# Patient Record
Sex: Male | Born: 1955
Health system: Southern US, Community
[De-identification: ages and names within clinical notes are randomized; demographics above are authoritative.]

## PROBLEM LIST (undated history)

## (undated) DIAGNOSIS — N401 Enlarged prostate with lower urinary tract symptoms: Secondary | ICD-10-CM

## (undated) DIAGNOSIS — R3912 Poor urinary stream: Secondary | ICD-10-CM

## (undated) DIAGNOSIS — E785 Hyperlipidemia, unspecified: Secondary | ICD-10-CM

## (undated) DIAGNOSIS — N138 Other obstructive and reflux uropathy: Secondary | ICD-10-CM

## (undated) DIAGNOSIS — K5904 Chronic idiopathic constipation: Secondary | ICD-10-CM

## (undated) DIAGNOSIS — N529 Male erectile dysfunction, unspecified: Secondary | ICD-10-CM

## (undated) DIAGNOSIS — E559 Vitamin D deficiency, unspecified: Secondary | ICD-10-CM

## (undated) DIAGNOSIS — R972 Elevated prostate specific antigen [PSA]: Secondary | ICD-10-CM

## (undated) DIAGNOSIS — E611 Iron deficiency: Secondary | ICD-10-CM

## (undated) DIAGNOSIS — A048 Other specified bacterial intestinal infections: Secondary | ICD-10-CM

## (undated) DIAGNOSIS — I10 Essential (primary) hypertension: Secondary | ICD-10-CM

## (undated) DIAGNOSIS — K219 Gastro-esophageal reflux disease without esophagitis: Secondary | ICD-10-CM

## (undated) HISTORY — PX: NO PAST SURGERIES: SHX2092

## (undated) HISTORY — DX: Other specified bacterial intestinal infections: A04.8

## (undated) HISTORY — PX: PROSTATE BIOPSY: SHX241

## (undated) HISTORY — DX: Hyperlipidemia, unspecified: E78.5

---

## 1995-12-30 HISTORY — PX: CYST REMOVAL TRUNK: SHX6283

## 2000-08-10 ENCOUNTER — Emergency Department (HOSPITAL_COMMUNITY): Admission: EM | Admit: 2000-08-10 | Discharge: 2000-08-10 | Payer: Self-pay | Admitting: *Deleted

## 2007-06-04 ENCOUNTER — Ambulatory Visit (HOSPITAL_COMMUNITY): Admission: RE | Admit: 2007-06-04 | Discharge: 2007-06-04 | Payer: Self-pay | Admitting: Infectious Diseases

## 2008-11-06 ENCOUNTER — Emergency Department (HOSPITAL_COMMUNITY): Admission: EM | Admit: 2008-11-06 | Discharge: 2008-11-06 | Payer: Self-pay | Admitting: Emergency Medicine

## 2008-11-09 ENCOUNTER — Emergency Department (HOSPITAL_COMMUNITY): Admission: EM | Admit: 2008-11-09 | Discharge: 2008-11-09 | Payer: Self-pay | Admitting: Emergency Medicine

## 2009-12-13 ENCOUNTER — Emergency Department (HOSPITAL_COMMUNITY): Admission: EM | Admit: 2009-12-13 | Discharge: 2009-12-13 | Payer: Self-pay | Admitting: Family Medicine

## 2010-10-07 ENCOUNTER — Encounter: Payer: Self-pay | Admitting: Cardiology

## 2010-10-10 ENCOUNTER — Ambulatory Visit: Payer: Self-pay | Admitting: Cardiology

## 2010-10-10 ENCOUNTER — Telehealth (INDEPENDENT_AMBULATORY_CARE_PROVIDER_SITE_OTHER): Payer: Self-pay | Admitting: *Deleted

## 2010-10-10 DIAGNOSIS — R079 Chest pain, unspecified: Secondary | ICD-10-CM | POA: Insufficient documentation

## 2010-10-14 ENCOUNTER — Encounter: Payer: Self-pay | Admitting: Cardiovascular Disease

## 2010-10-14 ENCOUNTER — Encounter: Payer: Self-pay | Admitting: *Deleted

## 2010-10-14 ENCOUNTER — Ambulatory Visit: Payer: Self-pay

## 2010-10-14 ENCOUNTER — Encounter (HOSPITAL_COMMUNITY)
Admission: RE | Admit: 2010-10-14 | Discharge: 2010-10-29 | Payer: Self-pay | Source: Home / Self Care | Attending: Cardiology | Admitting: Cardiology

## 2010-10-14 ENCOUNTER — Ambulatory Visit: Payer: Self-pay | Admitting: Cardiology

## 2010-10-16 ENCOUNTER — Encounter: Payer: Self-pay | Admitting: Cardiology

## 2010-10-17 ENCOUNTER — Telehealth: Payer: Self-pay | Admitting: Cardiology

## 2011-01-28 NOTE — Progress Notes (Signed)
Summary: stress test results  Phone Note Call from Patient Call back at Work Phone [phone]   Caller: Patient Summary of Call: stress test results Initial call taken by: Judie Grieve,  October 17, 2010 10:59 AM  Follow-up for Phone Call        Spoke with pt. Pt. aware of stress test results. Pt. verbalized understanding Follow-up by: Ollen Gross, RN, BSN,  October 17, 2010 11:06 AM

## 2011-01-28 NOTE — Progress Notes (Signed)
Summary: Nuclear pre procedure  Phone Note Outgoing Call Call back at Home Phone [phone]   Call placed by: Rea College, CMA,  October 10, 2010 3:30 PM Call placed to: Patient Summary of Call: Left message with information on Myoview Information Sheet (see scanned document for details).      Nuclear Med Background Indications for Stress Test: Evaluation for Ischemia     Symptoms: Chest Pain    Nuclear Pre-Procedure Cardiac Risk Factors: History of Smoking

## 2011-01-28 NOTE — Consult Note (Signed)
Summary: [facility]  [facility]   Imported By: Earl Many 11/27/2010 16:53:19  _____________________________________________________________________  External Attachment:    Type:   Image     Comment:   External Document

## 2011-01-28 NOTE — Letter (Signed)
Summary: Results Follow-up  [facility], Main Office  [address]   [place], [place] [address]   Phone: [phone]  Fax: [phone]     October 16, 2010 MRN: [id number]   Larry Martin [address] [place], [place]  [address]   Dear [name],  We have received the results from your recent tests and have been unable to contact you.  Please call our office at the number listed above so that Dr.  Shirlee Latch                           or his nurse may review the results with you.    Thank you,  [facility]

## 2011-01-28 NOTE — Assessment & Plan Note (Signed)
Summary: np6/chest pains   Visit Type:  new pt Primary Provider:  Dr. Renae Gloss  CC:  chest pain, headaches, and .  History of Present Illness: 55 yo with minimal past medical history presents for evaluation of chest/abdominal pain.  Patient has been having crampy, gas-type pains in his epigastrium.  This has been going on for several months.  He thinks that it occurs the most when he has not eaten for a long time (he works long hours and sometimes goes extended periods without eating).  The pain is not related to exertion.  He has excellent exercise tolerance.  He says that he can climb 6 flights of steps with no chest pain and minimal shortness of breath (from ground floor of hospital to 6th floor.  He also gets epigastric/lower chest burning at times after meals that is GERD-like. No syncope or lightheadedness.  No smoking.  No family history of premature CAD.  No HTN or hyperlipidemia.  He had similar symptoms back in 2004 that were thought to be noncardiac.   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  Married  Tobacco Use - Former, quit in 2010.  Alcohol Use - no Regular Exercise - no Smoking Status:  quit Does Patient Exercise:  no  Review of Systems       All systems reviewed and negative except as per HPI.   Vital Signs:  Patient profile:   55 year old male Height:      66 inches Weight:      156.25 pounds BMI:     25.31 Pulse rate:   66 / minute BP sitting:   90 / 58  (left  arm) Cuff size:   large  Vitals Entered By: Caralee Ates CMA (October 10, 2010 11:24 AM)  Physical Exam  General:  Well developed, well nourished, in no acute distress. Head:  normocephalic and atraumatic Nose:  no deformity, discharge, inflammation, or lesions Mouth:  Teeth, gums and palate normal. Oral mucosa normal. Neck:  Neck supple, no JVD. No masses, thyromegaly or abnormal cervical nodes. Lungs:  Clear bilaterally to auscultation and percussion. Heart:  Non-displaced PMI, chest non-tender; regular rate and rhythm, S1, S2 without murmurs, rubs or gallops. Carotid upstroke normal, no bruit.  Pedals normal pulses. No edema, no varicosities. Abdomen:  Bowel sounds positive; abdomen soft and non-tender without masses, organomegaly, or hernias noted. No hepatosplenomegaly. Msk:  Back normal, normal gait. Muscle strength and tone normal. Extremities:  No clubbing or cyanosis. Neurologic:  Alert and oriented x 3. Skin:  Intact without lesions or rashes. Psych:  Normal affect.   Impression & Recommendations:  Problem # 1:  CHEST PAIN (ICD-786.50) Patient's pain is actually more epigastric and manifests as a cramp when he has not eaten in a long time or GERD-like symptoms after eating certain foods.  I suspect that it is GI.  I will have him start omeprazole 20 mg daily.  Given the new onset, I will get an ETT-myoview to make sure that there is no evidence for cardiac  ischemia.   Other Orders: Nuclear Stress Test (Nuc Stress Test)  Patient Instructions: 1)  Your physician has recommended you make the following change in your medication:  2)  Take Omeprazole 20mg  daily--you do not need a prescription for this. 3)  Your physician has requested that you have an exercise stress myoview.  For further information please visit [email or web address]  Please follow instruction sheet, as given.THIS NEEDS TO BE MONDAY OCTOBER 19,1478. 4)  Your physician recommends that you schedule a follow-up  appointment as needed with Dr Shirlee Latch.

## 2011-01-28 NOTE — Assessment & Plan Note (Signed)
Summary: Cardiology Nuclear Testing  Nuclear Med Background Indications for Stress Test: Evaluation for Ischemia     Symptoms: Chest Pain    Nuclear Pre-Procedure Cardiac Risk Factors: History of Smoking Caffeine/Decaff Intake: none NPO After: 7:00 AM Lungs: clear IV 0.9% NS with Angio Cath: 22g     IV Site: R Hand IV Started by: Cathlyn Parsons, RN Chest Size (in) 42     Height (in): 66 Weight (lb): 154 BMI: 24.95  Nuclear Med Study 1 or 2 day study:  1 day     Stress Test Type:  Stress Reading MD:  Cassell Clement, MD     Referring MD:  D.McLean Resting Radionuclide:  Technetium 60m Tetrofosmin     Resting Radionuclide Dose:  10.5 mCi  Stress Radionuclide:  Technetium 80m Tetrofosmin     Stress Radionuclide Dose:  33.0 mCi   Stress Protocol Exercise Time (min):  9:01 min     Max HR:  146 bpm     Predicted Max HR:  169 bpm  Max Systolic BP: 171 mm Hg     Percent Max HR:  86.39 %     METS: 10.10 Rate Pressure Product:  16109    Stress Test Technologist:  Milana Na, EMT-P     Nuclear Technologist:  Harlow Asa, CNMT  Rest Procedure  Myocardial perfusion imaging was performed at rest 45 minutes following the intravenous administration of Technetium 63m Tetrofosmin.  Stress Procedure  The patient exercised for 9:01. The patient stopped due to fatigue and chest burning.  There were no significant ST-T wave changes.  Technetium 2m Tetrofosmin was injected at peak exercise and myocardial perfusion imaging was performed after a brief delay.  QPS Raw Data Images:  Normal; no motion artifact; normal heart/lung ratio. Stress Images:  Normal homogeneous uptake in all areas of the myocardium. Rest Images:  Normal homogeneous uptake in all areas of the myocardium. Subtraction (SDS):  No evidence of ischemia. Transient Ischemic Dilatation:  1.06  (Normal <1.22)  Lung/Heart Ratio:  .32  (Normal <0.45)  Quantitative Gated Spect Images QGS EDV:  95 ml QGS ESV:  44  ml QGS EF:  54 % QGS cine images:  Normal LV systolic function.  Findings Normal nuclear study      Overall Impression  Exercise Capacity: Good exercise capacity. BP Response: Normal blood pressure response. Clinical Symptoms: Mild chest pain/dyspnea. ECG Impression: No significant ST segment change suggestive of ischemia. Overall Impression: Normal stress nuclear study. Overall Impression Comments: No ischemia by perfusion study.  Normal LV systolic function.  Appended Document: Cardiology Nuclear Testing Normal myoview.   Appended Document: Cardiology Nuclear Testing  lm to cb left message to call back  Appended Document: Cardiology Nuclear Testing LVM  Appended Document: Cardiology Nuclear Testing LVM x2. Will mail a letter.  Appended Document: Cardiology Nuclear Testing Pt. aware.

## 2011-05-31 ENCOUNTER — Inpatient Hospital Stay (INDEPENDENT_AMBULATORY_CARE_PROVIDER_SITE_OTHER)
Admission: RE | Admit: 2011-05-31 | Discharge: 2011-05-31 | Disposition: A | Discharge status: Home / Self Care | Payer: 59 | Source: Ambulatory Visit | Attending: Family Medicine | Admitting: Family Medicine

## 2011-05-31 DIAGNOSIS — J019 Acute sinusitis, unspecified: Secondary | ICD-10-CM

## 2011-09-30 LAB — CBC
HCT: 45.9
Hemoglobin: 14.4
MCHC: 31.2
MCV: 76.5 — ABNORMAL LOW
Platelets: 165
RBC: 6 — ABNORMAL HIGH

## 2011-09-30 LAB — COMPREHENSIVE METABOLIC PANEL
BUN: 6
CO2: 27
Chloride: 106
GFR calc Af Amer: >60
Glucose, Bld: 89
Sodium: 140

## 2011-09-30 LAB — DIFFERENTIAL
Basophils Relative: 0
Eosinophils Relative: 0

## 2012-08-15 ENCOUNTER — Emergency Department (HOSPITAL_COMMUNITY)
Admission: EM | Admit: 2012-08-15 | Discharge: 2012-08-15 | Discharge status: Against Medical Advice | Payer: 59 | Attending: Emergency Medicine | Admitting: Emergency Medicine

## 2012-08-15 ENCOUNTER — Encounter (HOSPITAL_COMMUNITY): Payer: Self-pay | Admitting: *Deleted

## 2012-08-15 DIAGNOSIS — J029 Acute pharyngitis, unspecified: Secondary | ICD-10-CM | POA: Insufficient documentation

## 2012-08-15 NOTE — ED Notes (Signed)
Pt alert, arrives from home, c/o sore throat, onset was Wednesday, resp even unlabored, skin pwd

## 2013-01-05 ENCOUNTER — Encounter: Payer: Self-pay | Admitting: Cardiology

## 2013-01-12 ENCOUNTER — Ambulatory Visit: Payer: 59 | Admitting: Sports Medicine

## 2013-03-21 ENCOUNTER — Ambulatory Visit (INDEPENDENT_AMBULATORY_CARE_PROVIDER_SITE_OTHER): Payer: 59 | Admitting: Sports Medicine

## 2013-03-21 ENCOUNTER — Encounter: Payer: Self-pay | Admitting: Sports Medicine

## 2013-03-21 VITALS — BP 112/74 | HR 94 | Ht 66.0 in | Wt 155.0 lb

## 2013-03-21 DIAGNOSIS — M25559 Pain in unspecified hip: Secondary | ICD-10-CM

## 2013-03-21 DIAGNOSIS — M25551 Pain in right hip: Secondary | ICD-10-CM

## 2013-03-21 MED ORDER — MELOXICAM 15 MG PO TABS: ORAL_TABLET | ORAL | Status: DC

## 2013-03-21 NOTE — Progress Notes (Signed)
Subjective: Larry Martin is a pleasant 56-y.o. new patient who presents to clinic c/o right hamstring pain for the past 2 weeks.  Denies any trauma or injury related to onset.  Pain is mainly localized to the body of the hamstring muscle in the middle of his posterior right leg.  Some occasional extension of pain to the superior and inferior aspects of the hamstring muscles, but no radiation to the buttocks or lower leg.  He states that his pain is worst first thing in the morning as he goes to get out of bed or when he goes from a sedentary position to an active position (i.e., rising from a chair to walk).  Pain improves with activity and movement.  He has not been taking any pain medications for his discomfort.  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  Denies any previous injury of the right leg.  Patient does note that, approximately 3 months, ago he was lifting a bag of cement at home when he felt a sudden pain in his right-sided lower back.  He did not feel any pain in his leg/hamstring at that time and states that the lower back pain subsided within a couple of days.  Denies any further problems with his lower back.  Allergies: NKDA. Patient denies tobacco or alcohol use. Denies family history of diabetes, heart disease, or high blood pressure.   Review of Systems: Negative except as noted above.  Objective: GENERAL - Well-developed, well-nourished.  NAD. RIGHT LEG - No apparent deformity on inspection.  No swelling or bruising. O palpable defect. Tender to palpation over the hamstrings with increased point tenderness in the medial hamstring muscle belly.  Full range of motion with some hamstring pain on knee extension.  Resisted knee flexion elicits hamstring pain. Good quadriceps and hamstring strength.   Patellar and Achilles reflexes intact B/L. Right lower extremity is neurovascularly intact. BACK - No apparent deformity. Flexion at waist elicits right hamstring pain. Negative seated and supine SLR. Good strength bilaterally. Achilles and patellar reflexes are equal bilaterally. Patient walks without a significant limp.  LIMITED MSK ULTRASOUND RIGHT HAMSTRING -I do not appreciate any areas of abnormal hypoechogenicity or increased neovascularization to suggest injury. Scan was concentrated over the area of the semi-tendinosis/semimembranosus.  Assessment: 1. Right hamstring pain secondary to probable strain  Plan: No evidence of acute injury on ultrasound. Patient was fitted with a thigh compression sleeve and given an eccentric home exercise program.  He is to wear the compression sleeve at work and during activity. Mobic 15 mg x7 days, then PRN for pain.  He will return to clinic for follow-up in 3 weeks.  If his symptoms persist or worsen, we will consider initiating formal physical therapy.   Dictated by Lonzo Candy, MS4

## 2013-04-18 ENCOUNTER — Ambulatory Visit: Payer: 59 | Admitting: Sports Medicine

## 2013-04-25 ENCOUNTER — Ambulatory Visit: Payer: 59 | Admitting: Sports Medicine

## 2014-12-24 ENCOUNTER — Encounter (HOSPITAL_COMMUNITY): Payer: Self-pay | Admitting: Emergency Medicine

## 2014-12-24 ENCOUNTER — Observation Stay (HOSPITAL_COMMUNITY)
Admission: EM | Admit: 2014-12-24 | Discharge: 2014-12-26 | Disposition: A | Discharge status: Home / Self Care | Payer: 59 | Attending: Internal Medicine | Admitting: Internal Medicine

## 2014-12-24 ENCOUNTER — Emergency Department (HOSPITAL_COMMUNITY): Payer: 59

## 2014-12-24 DIAGNOSIS — R079 Chest pain, unspecified: Secondary | ICD-10-CM | POA: Diagnosis not present

## 2014-12-24 DIAGNOSIS — Z720 Tobacco use: Secondary | ICD-10-CM | POA: Diagnosis not present

## 2014-12-24 DIAGNOSIS — R5383 Other fatigue: Secondary | ICD-10-CM | POA: Diagnosis not present

## 2014-12-24 DIAGNOSIS — Z7982 Long term (current) use of aspirin: Secondary | ICD-10-CM | POA: Diagnosis not present

## 2014-12-24 DIAGNOSIS — E785 Hyperlipidemia, unspecified: Secondary | ICD-10-CM | POA: Diagnosis present

## 2014-12-24 DIAGNOSIS — R0602 Shortness of breath: Secondary | ICD-10-CM | POA: Insufficient documentation

## 2014-12-24 LAB — CBC
HEMATOCRIT: 43 % (ref 39.0–52.0)
Hemoglobin: 13.8 g/dL (ref 13.0–17.0)
MCH: 24.4 pg — ABNORMAL LOW (ref 26.0–34.0)
MCHC: 32.1 g/dL (ref 30.0–36.0)
MCV: 76.1 fL — ABNORMAL LOW (ref 78.0–100.0)
Platelets: 167 10*3/uL (ref 150–400)
RBC: 5.65 MIL/uL (ref 4.22–5.81)
RDW: 13.1 % (ref 11.5–15.5)
WBC: 4.5 10*3/uL (ref 4.0–10.5)

## 2014-12-24 LAB — I-STAT TROPONIN, ED: Troponin i, poc: 0 ng/mL (ref 0.00–0.08)

## 2014-12-24 LAB — BASIC METABOLIC PANEL
ANION GAP: 9 (ref 5–15)
BUN: 11 mg/dL (ref 6–23)
CALCIUM: 9.3 mg/dL (ref 8.4–10.5)
CO2: 23 mmol/L (ref 19–32)
CREATININE: 1.17 mg/dL (ref 0.50–1.35)
Chloride: 106 mEq/L (ref 96–112)
GFR calc Af Amer: 79 mL/min — ABNORMAL LOW (ref >90)
GFR calc non Af Amer: 68 mL/min — ABNORMAL LOW (ref >90)
GLUCOSE: 130 mg/dL — AB (ref 70–99)
Potassium: 3.8 mmol/L (ref 3.5–5.1)
Sodium: 138 mmol/L (ref 135–145)

## 2014-12-24 LAB — TROPONIN I: Troponin I: <0.03 ng/mL (ref <0.031)

## 2014-12-24 MED ORDER — ONDANSETRON HCL 4 MG/2ML IJ SOLN: 4.0000 mg | Freq: Four times a day (QID) | INTRAMUSCULAR | Status: DC | PRN

## 2014-12-24 MED ORDER — ENOXAPARIN SODIUM 40 MG/0.4ML ~~LOC~~ SOLN
40.0000 mg | SUBCUTANEOUS | Status: DC
  Administered 2014-12-24: 40 mg via SUBCUTANEOUS
  Filled 2014-12-24 (×2): qty 0.4

## 2014-12-24 MED ORDER — OXYCODONE HCL 5 MG PO TABS: 5.0000 mg | ORAL_TABLET | ORAL | Status: DC | PRN

## 2014-12-24 MED ORDER — ASPIRIN EC 325 MG PO TBEC
325.0000 mg | DELAYED_RELEASE_TABLET | Freq: Every day | ORAL | Status: DC
  Administered 2014-12-25 – 2014-12-26 (×2): 325 mg via ORAL
  Filled 2014-12-24 (×2): qty 1

## 2014-12-24 MED ORDER — SODIUM CHLORIDE 0.9 % IV SOLN: 1000.0000 mL | INTRAVENOUS | Status: DC

## 2014-12-24 MED ORDER — SODIUM CHLORIDE 0.9 % IV SOLN: 1000.0000 mL | Freq: Once | INTRAVENOUS | Status: DC

## 2014-12-24 MED ORDER — SODIUM CHLORIDE 0.9 % IV SOLN: 250.0000 mL | INTRAVENOUS | Status: DC | PRN

## 2014-12-24 MED ORDER — FENTANYL CITRATE 0.05 MG/ML IJ SOLN
25.0000 ug | Freq: Once | INTRAMUSCULAR | Status: AC
  Administered 2014-12-24: 25 ug via INTRAVENOUS
  Filled 2014-12-24: qty 2

## 2014-12-24 MED ORDER — SODIUM CHLORIDE 0.9 % IJ SOLN
3.0000 mL | Freq: Two times a day (BID) | INTRAMUSCULAR | Status: DC
  Administered 2014-12-24 – 2014-12-26 (×3): 3 mL via INTRAVENOUS

## 2014-12-24 MED ORDER — SODIUM CHLORIDE 0.9 % IJ SOLN: 3.0000 mL | Freq: Two times a day (BID) | INTRAMUSCULAR | Status: DC

## 2014-12-24 MED ORDER — ACETAMINOPHEN 325 MG PO TABS: 650.0000 mg | ORAL_TABLET | Freq: Four times a day (QID) | ORAL | Status: DC | PRN

## 2014-12-24 MED ORDER — ACETAMINOPHEN 650 MG RE SUPP: 650.0000 mg | Freq: Four times a day (QID) | RECTAL | Status: DC | PRN

## 2014-12-24 MED ORDER — SODIUM CHLORIDE 0.9 % IJ SOLN: 3.0000 mL | INTRAMUSCULAR | Status: DC | PRN

## 2014-12-24 MED ORDER — ALUM & MAG HYDROXIDE-SIMETH 200-200-20 MG/5ML PO SUSP
30.0000 mL | Freq: Four times a day (QID) | ORAL | Status: DC | PRN
Start: 2014-12-24 — End: 2014-12-26

## 2014-12-24 MED ORDER — HYDROMORPHONE HCL 1 MG/ML IJ SOLN
0.5000 mg | INTRAMUSCULAR | Status: DC | PRN
  Administered 2014-12-25: 1 mg via INTRAVENOUS
  Administered 2014-12-25: 0.5 mg via INTRAVENOUS
  Filled 2014-12-24 (×2): qty 1

## 2014-12-24 MED ORDER — ONDANSETRON HCL 4 MG PO TABS: 4.0000 mg | ORAL_TABLET | Freq: Four times a day (QID) | ORAL | Status: DC | PRN

## 2014-12-24 MED ORDER — SODIUM CHLORIDE 0.9 % IV BOLUS (SEPSIS)
1000.0000 mL | Freq: Once | INTRAVENOUS | Status: AC
  Administered 2014-12-24: 1000 mL via INTRAVENOUS

## 2014-12-24 MED ORDER — SODIUM CHLORIDE 0.9 % IV SOLN
INTRAVENOUS | Status: DC
  Administered 2014-12-24: 23:00:00 via INTRAVENOUS

## 2014-12-24 NOTE — ED Provider Notes (Signed)
CSN: [id number]     Arrival date & time 12/24/14  1700 History   First MD Initiated Contact with Patient 12/24/14 1701     Chief Complaint  Patient presents with  . Chest Pain     (Consider location/radiation/quality/duration/timing/severity/associated sxs/prior Treatment) Patient is a 58 y.o. male presenting with chest pain.  Chest Pain Pain location:  Substernal area Pain quality: pressure   Pain radiates to:  L shoulder and R shoulder Pain radiates to the back: no   Pain severity:  Severe Onset quality:  Gradual Duration:  1 week Timing:  Intermittent Progression:  Waxing and waning Chronicity:  New (had CP over 3 years ago, had stress test by Dr. Jackquline Denmark) Relieved by:  Nitroglycerin and rest Worsened by:  Exertion and deep breathing Ineffective treatments:  None tried Associated symptoms: fatigue and shortness of breath (beginning today)   Associated symptoms: no abdominal pain, no back pain, no cough, no diaphoresis, no fever, no headache, no nausea, no numbness, no orthopnea, no syncope, not vomiting and no weakness   Risk factors: male sex and smoking   Risk factors: no coronary artery disease, no diabetes mellitus, no high cholesterol and no hypertension   Risk factors comment:  No early familiy hx of CAD   History reviewed. No pertinent past medical history. History reviewed. No pertinent past surgical history. History reviewed. No pertinent family history. History  Substance Use Topics  . Smoking status: Current Every Day Smoker -- 0.30 packs/day    Types: Cigarettes  . Smokeless tobacco: Never Used  . Alcohol Use: No    Review of Systems  Constitutional: Positive for fatigue. Negative for fever and diaphoresis.  HENT: Negative for sore throat.   Eyes: Negative for visual disturbance.  Respiratory: Positive for shortness of breath (beginning today). Negative for cough.   Cardiovascular: Positive for chest pain (pressure). Negative for orthopnea and syncope.   Gastrointestinal: Negative for nausea, vomiting and abdominal pain.  Genitourinary: Negative for difficulty urinating.  Musculoskeletal: Negative for back pain and neck stiffness.  Skin: Negative for rash.  Neurological: Negative for syncope, weakness, numbness and headaches.      Allergies  Review of patient's allergies indicates no known allergies.  Home Medications   Prior to Admission medications   Medication Sig Start Date End Date Taking? Authorizing Provider  aspirin 325 MG tablet Take 325 mg by mouth daily.   Yes Historical Provider, MD  meloxicam (MOBIC) 15 MG tablet Take 1 tablet by mouth daily for 7 days with food, then take as needed. Patient not taking: Reported on 12/24/2014 03/21/13   Carlos Levering Draper, DO   BP 101/52 mmHg  Pulse 64  Temp(Src) 97.8 F (36.6 C) (Oral)  Resp 14  Ht 5\' 6"  (1.676 m)  Wt 167 lb 11.2 oz (76.068 kg)  BMI 27.08 kg/m2  SpO2 99% Physical Exam  Constitutional: He is oriented to person, place, and time. He appears well-developed and well-nourished. No distress.  HENT:  Head: Normocephalic and atraumatic.  Eyes: Conjunctivae and EOM are normal.  Neck: Normal range of motion.  Cardiovascular: Normal rate, regular rhythm, normal heart sounds and intact distal pulses.  Exam reveals no gallop and no friction rub.   No murmur heard. Pulmonary/Chest: Effort normal and breath sounds normal. No respiratory distress. He has no wheezes. He has no rales.  Abdominal: Soft. He exhibits no distension. There is no tenderness. There is no guarding.  Musculoskeletal: He exhibits no edema or tenderness.  Neurological: He is alert  and oriented to person, place, and time.  Skin: Skin is warm and dry. He is not diaphoretic.  Nursing note and vitals reviewed.   ED Course  Procedures (including critical care time) Labs Review Labs Reviewed  CBC - Abnormal; Notable for the following:    MCV 76.1 (*)    MCH 24.4 (*)    All other components within normal  limits  BASIC METABOLIC PANEL - Abnormal; Notable for the following:    Glucose, Bld 130 (*)    GFR calc non Af Amer 68 (*)    GFR calc Af Amer 79 (*)    All other components within normal limits  TROPONIN I  TROPONIN I  BASIC METABOLIC PANEL  CBC  TROPONIN I  TROPONIN I  LIPID PANEL  I-STAT TROPOININ, ED    Imaging Review Dg Chest 2 View  12/24/2014   CLINICAL DATA:  Shortness of breath and chest pain  EXAM: CHEST  2 VIEW  COMPARISON:  06/04/2007  FINDINGS: The heart size and mediastinal contours are within normal limits. Both lungs are clear. The visualized skeletal structures are unremarkable.  IMPRESSION: No active cardiopulmonary disease.   Electronically Signed   By: Inez Catalina M.D.   On: 12/24/2014 19:18     EKG Interpretation None       EMERGENCY DEPARTMENT [place] CARDIAC EXAM "Study: Limited Ultrasound of the heart and pericardium"  INDICATIONS:Hypotension Multiple views of the heart and pericardium were obtained in real-time with a multi-frequency probe.  PERFORMED [id number]  IMAGES ARCHIVED?: Yes  FINDINGS: No pericardial effusion, Normal contractility, IVC normal and Tamponade physiology absent  LIMITATIONS:  None  VIEWS USED: Subcostal 4 chamber, Parasternal long axis, Parasternal short axis, Apical 4 chamber  and Inferior Vena Cava  INTERPRETATION: Cardiac activity present, Pericardial effusioin absent, Cardiac tamponade absent, Volume status normal and Normal contractility   MDM   Final diagnoses:  Chest pain    58 year old male with no significant medical history presents with concern for increasing chest pressure on exertion for one week. Differential diagnosis includes PE, pericarditis, pneumothorax, ACS, angina, dissection.  EKG was done and evaluated by me and showed normal sinus rhythm without any acute ST changes. Chest x-ray was within normal limits.  Patient received aspirin 324mg  at home and nitroglycerin with EMS and had improvement of his  chest pain, however blood pressure decreased.  Given patient's transient hypotension a bedside ultrasound was performed which showed no signs of pericardial effusion or tamponade. His blood pressures continued to improve and feel his hypotension was likely secondary to nitroglycerin.  Symptoms are not consistent with aortic dissection, pulmonary embolism, or pericarditis.  Troponin was checked and was negative. Hospitalist was consulted for admission given concern for typical symptoms of angina with increasing frequency and presence of rest. He was admitted in stable condition.    Alvino Chapel, MD 12/25/14 6226  Ezequiel Essex, MD 12/25/14 3335

## 2014-12-24 NOTE — H&P (Signed)
[facility] Admission History and Physical       Larry Martin LGX:[id number] DOB: 01-30-1956 DOA: 12/24/2014  Referring physician: EDP PCP: Salena Saner., MD  Specialists:   Chief Complaint: Chest Pain  HPI: Larry Martin is a 58 y.o. male with a history of Hyperlipidemia who presents with complaints of intermittent chest pain described as pressure like and as heaviness in the substernal chest for 1 week.  The pain is associated with SOB and occasionally diaphoresis and lasts for minutes at a time.   He rates the pain at the worse has been a 7/10.   He denies any nausea or vomiting.   The pain occurs with exertion.    His ED workup has been negative so far.   He reports that he had a Stress Test several years ago.      Review of Systems:  Constitutional: No Weight Loss, No Weight Gain, Night Sweats, Fevers, Chills, Dizziness, Fatigue, or Generalized Weakness HEENT: No Headaches, Difficulty Swallowing,Tooth/Dental Problems,Sore Throat,  No Sneezing, Rhinitis, Ear Ache, Nasal Congestion, or Post Nasal Drip,  Cardio-vascular:   +Chest pain, Orthopnea, PND, Edema in Lower Extremities, Anasarca, Dizziness, Palpitations  Resp: +Dyspnea, No DOE, No Productive Cough, No Non-Productive Cough, No Hemoptysis, No Wheezing.    GI: No Heartburn, Indigestion, Abdominal Pain, Nausea, Vomiting, Diarrhea, Hematemesis, Hematochezia, Melena, Change in Bowel Habits,  Loss of Appetite  GU: No Dysuria, Change in Color of Urine, No Urgency or Frequency, No Flank pain.  Musculoskeletal: No Joint Pain or Swelling, No Decreased Range of Motion, No Back Pain.  Neurologic: No Syncope, No Seizures, Muscle Weakness, Paresthesia, Vision Disturbance or Loss, No Diplopia, No Vertigo, No Difficulty Walking,  Skin: No Rash or Lesions. Psych: No Change in Mood or Affect, No Depression or Anxiety, No Memory loss, No Confusion, or Hallucinations   History reviewed. No pertinent past medical history.  except for Hyperlipidemia    History reviewed. No pertinent past surgical history.     Prior to Admission medications   Medication Sig Start Date End Date Taking? Authorizing Provider  aspirin 325 MG tablet Take 325 mg by mouth daily.   Yes Historical Provider, MD  meloxicam (MOBIC) 15 MG tablet Take 1 tablet by mouth daily for 7 days with food, then take as needed. Patient not taking: Reported on 12/24/2014 03/21/13   Thurman Coyer, DO      No Known Allergies   Social History:  reports that he has been smoking Cigarettes.  He has been smoking about 0.30 packs per day. He has never used smokeless tobacco. He reports that he does not drink alcohol. His drug history is not on file.     History reviewed. No pertinent family history.     Physical Exam:  GEN:  Pleasant Well Nourished and Well developed  58 y.o. Haitian-American male  examined  and in no acute distress; cooperative with exam Filed Vitals:   12/24/14 2017 12/24/14 2100 12/24/14 2115 12/24/14 2201  BP: 101/71 116/77 115/75 105/61  Pulse: 58 64 66 65  Temp:    98.6 F (37 C)  TempSrc:      Resp: 16   15  Height:    5\' 6"  (1.676 m)  Weight:    76.068 kg (167 lb 11.2 oz)  SpO2: 98% 99% 100% 100%   Blood pressure 105/61, pulse 65, temperature 98.6 F (37 C), temperature source Oral, resp. rate 15, height 5\' 6"  (1.676 m), weight 76.068 kg (167 lb 11.2 oz),  SpO2 100 %. PSYCH: He is alert and oriented x4; does not appear anxious does not appear depressed; affect is normal HEENT: Normocephalic and Atraumatic, Mucous membranes pink; PERRLA; EOM intact; Fundi:  Benign;  No scleral icterus, Nares: Patent, Oropharynx: Clear, Fair Dentition,    Neck:  FROM, No Cervical Lymphadenopathy nor Thyromegaly or Carotid Bruit; No JVD; Breasts:: Not examined CHEST WALL: No tenderness CHEST: Normal respiration, clear to auscultation bilaterally HEART: Regular rate and rhythm; no murmurs rubs or gallops BACK: No kyphosis or  scoliosis; No CVA tenderness ABDOMEN: Positive Bowel Sounds, Soft Non-Tender; No Masses, No Organomegaly,. Rectal Exam: Not done EXTREMITIES: No Cyanosis, Clubbing, or Edema; No Ulcerations. Genitalia: not examined PULSES: 2+ and symmetric SKIN: Normal hydration no rash or ulceration CNS: Alert and Oriented x 4, No Focal Deficits  Vascular: pulses palpable throughout    Labs on Admission:  Basic Metabolic Panel:  Recent Labs Lab 12/24/14 1719  NA 138  K 3.8  CL 106  CO2 23  GLUCOSE 130*  BUN 11  CREATININE 1.17  CALCIUM 9.3   Liver Function Tests: No results for input(s): AST, ALT, ALKPHOS, BILITOT, PROT, ALBUMIN in the last 168 hours. No results for input(s): LIPASE, AMYLASE in the last 168 hours. No results for input(s): AMMONIA in the last 168 hours. CBC:  Recent Labs Lab 12/24/14 1719  WBC 4.5  HGB 13.8  HCT 43.0  MCV 76.1*  PLT 167   Cardiac Enzymes:  Recent Labs Lab 12/24/14 2002  TROPONINI <0.03    BNP (last 3 results) No results for input(s): PROBNP in the last 8760 hours. CBG: No results for input(s): GLUCAP in the last 168 hours.  Radiological Exams on Admission: Dg Chest 2 View  12/24/2014   CLINICAL DATA:  Shortness of breath and chest pain  EXAM: CHEST  2 VIEW  COMPARISON:  06/04/2007  FINDINGS: The heart size and mediastinal contours are within normal limits. Both lungs are clear. The visualized skeletal structures are unremarkable.  IMPRESSION: No active cardiopulmonary disease.   Electronically Signed   By: Inez Catalina M.D.   On: 12/24/2014 19:18     EKG: Independently reviewed. Normal Sinus Rhythm rate 78   Assessment/Plan:   58 y.o. male with   Principal Problem:   1.   Chest pain   Telemetry Monitoring   Cycle Troponins   ASA Rx   Check Lipids in AM   2D ECHO in AM   Active Problems:   2.   Hyperlipidemia- on OTC Niacin Rx   Check Fasting Lipids in AM       3.   DVT Prophylaxis    Lovenox    Code Status:    FULL  CODE Family Communication:    Wife at Bedside Disposition Plan:       Observation /Telemtry Bed  Time spent:  21 Minutes  Theressa Millard [facility] Pager [phone]   If [place] Please Contact the Day Rounding Team MD for [facility]  If 7PM-7AM, Please Contact Night-Floor Coverage  [email or web address] Password [facility] 12/24/2014, 10:31 PM

## 2014-12-24 NOTE — ED Notes (Signed)
PER EMS: Pt was picked up after worsening chest pain that has been intermittent for the past week.  Pt sts pain is worse with exertion.  Pt took 324 of ASA before EMS arrived, and had 2 nitro during transit.  Pain went from 7 to 4, but BP also dropped so treatment was stopped.

## 2014-12-25 ENCOUNTER — Observation Stay (HOSPITAL_COMMUNITY): Payer: 59

## 2014-12-25 DIAGNOSIS — E785 Hyperlipidemia, unspecified: Secondary | ICD-10-CM

## 2014-12-25 DIAGNOSIS — R079 Chest pain, unspecified: Secondary | ICD-10-CM

## 2014-12-25 DIAGNOSIS — I369 Nonrheumatic tricuspid valve disorder, unspecified: Secondary | ICD-10-CM

## 2014-12-25 DIAGNOSIS — R0789 Other chest pain: Secondary | ICD-10-CM

## 2014-12-25 LAB — CBC
HEMATOCRIT: 42.2 % (ref 39.0–52.0)
Hemoglobin: 12.9 g/dL — ABNORMAL LOW (ref 13.0–17.0)
MCH: 23.1 pg — ABNORMAL LOW (ref 26.0–34.0)
MCHC: 30.6 g/dL (ref 30.0–36.0)
MCV: 75.6 fL — ABNORMAL LOW (ref 78.0–100.0)
PLATELETS: 143 10*3/uL — AB (ref 150–400)
RBC: 5.58 MIL/uL (ref 4.22–5.81)
RDW: 13.2 % (ref 11.5–15.5)
WBC: 4 10*3/uL (ref 4.0–10.5)

## 2014-12-25 LAB — BASIC METABOLIC PANEL
ANION GAP: 8 (ref 5–15)
BUN: 11 mg/dL (ref 6–23)
CALCIUM: 8.5 mg/dL (ref 8.4–10.5)
CO2: 22 mmol/L (ref 19–32)
Chloride: 110 mEq/L (ref 96–112)
Creatinine, Ser: 1.05 mg/dL (ref 0.50–1.35)
GFR calc Af Amer: 90 mL/min — ABNORMAL LOW (ref >90)
GFR calc non Af Amer: 78 mL/min — ABNORMAL LOW (ref >90)
Glucose, Bld: 95 mg/dL (ref 70–99)
Potassium: 4.1 mmol/L (ref 3.5–5.1)
SODIUM: 140 mmol/L (ref 135–145)

## 2014-12-25 LAB — TROPONIN I: Troponin I: <0.03 ng/mL (ref <0.031)

## 2014-12-25 LAB — LIPID PANEL
Cholesterol: 180 mg/dL (ref 0–200)
HDL: 39 mg/dL — AB (ref >39)
LDL Cholesterol: 108 mg/dL — ABNORMAL HIGH (ref 0–99)
Total CHOL/HDL Ratio: 4.6 RATIO
Triglycerides: 166 mg/dL — ABNORMAL HIGH (ref <150)
VLDL: 33 mg/dL (ref 0–40)

## 2014-12-25 MED ORDER — TECHNETIUM TC 99M SESTAMIBI - CARDIOLITE
30.0000 | Freq: Once | INTRAVENOUS | Status: AC | PRN
  Administered 2014-12-25: 30 via INTRAVENOUS

## 2014-12-25 MED ORDER — TECHNETIUM TC 99M SESTAMIBI - CARDIOLITE
10.0000 | Freq: Once | INTRAVENOUS | Status: AC | PRN
  Administered 2014-12-25: 13:00:00 10 via INTRAVENOUS

## 2014-12-25 NOTE — Progress Notes (Signed)
Patient Demographics  Larry Martin, is a 58 y.o. male, DOB - 1956/07/19, PIR:[id number]  Admit date - 12/24/2014   Admitting Physician Theressa Millard, MD  Outpatient Primary MD for the patient is No primary care provider on file.  LOS - 1   Chief Complaint  Patient presents with  . Chest Pain      Admission history of present illness/brief narrative:  Larry Martin is a 58 y.o. male with a history of Hyperlipidemia who presents with complaints of intermittent chest pain described as pressure like and as heaviness in the substernal chest for 1 week. The pain is associated with SOB and occasionally diaphoresis and lasts for minutes at a time. He rates the pain at the worse has been a 7/10. He denies any nausea or vomiting. The pain occurs with exertion.He had negative troponins 3, no acute EKG changes, went for stress test on 12/28, finding significant for small area of mild reversibility involving the apical segment of the anterior wall.  Subjective:   Larry Martin today has, No headache, No chest pain, No abdominal pain - No Nausea, No new weakness tingling or numbness, No Cough - SOB.   Assessment & Plan    Principal Problem:   Chest pain Active Problems:   Hyperlipidemia  Chest pain - Continue on telemetry monitoring,  - Troponins are negative X4. - continue with aspirin,  - 2-D echo showing EF 50-55 percent, grade 2 diastolic dysfunction - Nuclear stress test showing small area of reversibility involving the apical segment of the inferior wall.     Code Status: Full code  Family Communication: None at bedside  Disposition Plan: Remains on telemetry   Procedures  Nuclear stress test 12/28   Consults   Cardiology  Medications  Scheduled Meds: . aspirin EC  325 mg Oral Daily  . enoxaparin (LOVENOX) injection  40 mg  Subcutaneous Q24H  . sodium chloride  3 mL Intravenous Q12H  . sodium chloride  3 mL Intravenous Q12H   Continuous Infusions: . sodium chloride 75 mL/hr at 12/24/14 2308   PRN Meds:.sodium chloride, acetaminophen **OR** acetaminophen, alum & mag hydroxide-simeth, HYDROmorphone (DILAUDID) injection, ondansetron **OR** ondansetron (ZOFRAN) IV, oxyCODONE, sodium chloride  DVT Prophylaxis  Lovenox -   Lab Results  Component Value Date   PLT 143* 12/25/2014    Antibiotics    Anti-infectives    None          Objective:   Filed Vitals:   12/25/14 1151 12/25/14 1152 12/25/14 1153 12/25/14 1610  BP: 179/67  151/66 119/72  Pulse: 148 154 106 75  Temp:    98.6 F (37 C)  TempSrc:    Oral  Resp:    19  Height:      Weight:      SpO2:    99%    Wt Readings from Last 3 Encounters:  12/25/14 76.386 kg (168 lb 6.4 oz)  03/21/13 70.308 kg (155 lb)  10/14/10 69.854 kg (154 lb)     Intake/Output Summary (Last 24 hours) at 12/25/14 1720 Last data filed at 12/25/14 1612  Gross per 24 hour  Intake      0 ml  Output    300 ml  Net   -  300 ml     Physical Exam  Awake Alert, Oriented X 3, No new F.N deficits, Normal affect Erma.AT,PERRAL Supple Neck,No JVD, No cervical lymphadenopathy appriciated.  Symmetrical Chest wall movement, Good air movement bilaterally, CTAB RRR,No Gallops,Rubs or new Murmurs, No Parasternal Heave +ve B.Sounds, Abd Soft, No tenderness, No organomegaly appriciated, No rebound - guarding or rigidity. No Cyanosis, Clubbing or edema, No new Rash or bruise     Data Review   Micro Results No results found for this or any previous visit (from the past 240 hour(s)).  Radiology Reports Dg Chest 2 View  12/24/2014   CLINICAL DATA:  Shortness of breath and chest pain  EXAM: CHEST  2 VIEW  COMPARISON:  06/04/2007  FINDINGS: The heart size and mediastinal contours are within normal limits. Both lungs are clear. The visualized skeletal structures are  unremarkable.  IMPRESSION: No active cardiopulmonary disease.   Electronically Signed   By: Inez Catalina M.D.   On: 12/24/2014 19:18   Nm Myocar Multi W/spect W/wall Motion / Ef  12/25/2014   CLINICAL DATA:  Shortness of Breath and chest pain  EXAM: MYOCARDIAL IMAGING WITH SPECT (REST AND PHARMACOLOGIC-STRESS)  GATED LEFT VENTRICULAR WALL MOTION STUDY  LEFT VENTRICULAR EJECTION FRACTION  TECHNIQUE: Standard myocardial SPECT imaging was performed after resting intravenous injection of 10 mCi Tc-21m sestamibi. Subsequently, intravenous infusion of Lexiscan was performed under the supervision of the Cardiology staff. At peak effect of the drug, 30 mCi Tc-11m sestamibi was injected intravenously and standard myocardial SPECT imaging was performed. Quantitative gated imaging was also performed to evaluate left ventricular wall motion, and estimate left ventricular ejection fraction.  COMPARISON:  None.  FINDINGS: Perfusion: There is a small area of mild reversibility involving the apical segment of the anterior wall.  Wall Motion: Normal left ventricular wall motion. No left ventricular dilation.  Left Ventricular Ejection Fraction: 62 %  End diastolic volume 81 ml  End systolic volume 31 ml  IMPRESSION: 1. Small area of reversibility is identified involving the apical segment of the anterior wall.  2. Normal left ventricular wall motion.  3. Left ventricular ejection fraction 62%  4. Low-risk stress test findings*.  These results will be called to the ordering clinician or representative by the Radiologist Assistant, and communication documented in the PACS or zVision Dashboard.  *2012 Appropriate Use Criteria for Coronary Revascularization Focused Update: J Am Coll Cardiol. 5852;77(8):242-353. http://content.airportbarriers.com.aspx?articleid=1201161   Electronically Signed   By: Kerby Moors M.D.   On: 12/25/2014 16:39    CBC  Recent Labs Lab 12/24/14 1719 12/25/14 0514  WBC 4.5 4.0  HGB 13.8 12.9*    HCT 43.0 42.2  PLT 167 143*  MCV 76.1* 75.6*  MCH 24.4* 23.1*  MCHC 32.1 30.6  RDW 13.1 13.2    Chemistries   Recent Labs Lab 12/24/14 1719 12/25/14 0514  NA 138 140  K 3.8 4.1  CL 106 110  CO2 23 22  GLUCOSE 130* 95  BUN 11 11  CREATININE 1.17 1.05  CALCIUM 9.3 8.5   ------------------------------------------------------------------------------------------------------------------ estimated creatinine clearance is 70.9 mL/min (by C-G formula based on Cr of 1.05). ------------------------------------------------------------------------------------------------------------------ No results for input(s): HGBA1C in the last 72 hours. ------------------------------------------------------------------------------------------------------------------  Recent Labs  12/25/14 0514  CHOL 180  HDL 39*  LDLCALC 108*  TRIG 166*  CHOLHDL 4.6   ------------------------------------------------------------------------------------------------------------------ No results for input(s): TSH, T4TOTAL, T3FREE, THYROIDAB in the last 72 hours.  Invalid input(s): FREET3 ------------------------------------------------------------------------------------------------------------------ No results for input(s): VITAMINB12, FOLATE, FERRITIN,  TIBC, IRON, RETICCTPCT in the last 72 hours.  Coagulation profile No results for input(s): INR, PROTIME in the last 168 hours.  No results for input(s): DDIMER in the last 72 hours.  Cardiac Enzymes  Recent Labs Lab 12/24/14 2239 12/25/14 0514 12/25/14 1404  TROPONINI <0.03 <0.03 <0.03   ------------------------------------------------------------------------------------------------------------------ Invalid input(s): POCBNP     Time Spent in minutes   30 minutes   Hiroko Tregre M.D on 12/25/2014 at 5:20 PM  Between 7am to 7pm - Pager - [phone]  After 7pm go to [email or web address] - password [email or web address]  And look for the night coverage  person covering for me after hours  [facility] Office  [phone]   **Disclaimer: This note may have been dictated with voice recognition software. Similar sounding words can inadvertently be transcribed and this note may contain transcription errors which may not have been corrected upon publication of note.**

## 2014-12-25 NOTE — Progress Notes (Signed)
GXT MV performed.

## 2014-12-25 NOTE — Progress Notes (Signed)
Patient had an episode of mid sternal chest pain, with mild reproduction  Palpation, resolved with dilaudid, will recheck tropnins in am, repeat EKG in am.

## 2014-12-25 NOTE — Progress Notes (Signed)
  Echocardiogram 2D Echocardiogram has been performed.  Diamond Nickel 12/25/2014, 9:33 AM

## 2014-12-25 NOTE — Progress Notes (Signed)
UR completed 

## 2014-12-25 NOTE — Consult Note (Signed)
CONSULT NOTE  Date: 12/25/2014               Patient Name:  Larry Martin MRN: [id number]  DOB: 1956/03/06 Age / Sex: 58 y.o., male        PCP: Salena Saner Primary Cardiologist: Aundra Dubin            Referring Physician: Elgergawy              Reason for Consult: Chest pain            History of Present Illness: Patient is a 58 y.o. male with a PMHx of hyperlipidemia, who was admitted to [facility] on 12/24/2014 for evaluation of  Chest discomfort. Marland Kitchen  Has had similar CP in the past Had a stress test 4 years ago    Location: mid sternal . Quality: gas like pain, heavy,  Duration: lasted 30 minutes  Timing:  Associated signs and symptoms: belcing.  Modifying factors: reliefed with belching  Context: worse with walking and activity  Occasional cigarette Occasional beer  Fhx : no cardiac hx.     Medications: Outpatient medications: Prescriptions prior to admission  Medication Sig Dispense Refill Last Dose  . aspirin 325 MG tablet Take 325 mg by mouth daily.   Past Month at Unknown time  . meloxicam (MOBIC) 15 MG tablet Take 1 tablet by mouth daily for 7 days with food, then take as needed. (Patient not taking: Reported on 12/24/2014) 40 tablet 0 Not Taking at Unknown time    Current medications: Current Facility-Administered Medications  Medication Dose Route Frequency Provider Last Rate Last Dose  . 0.9 %  sodium chloride infusion  250 mL Intravenous PRN Theressa Millard, MD      . 0.9 %  sodium chloride infusion   Intravenous Continuous Theressa Millard, MD 75 mL/hr at 12/24/14 2308    . acetaminophen (TYLENOL) tablet 650 mg  650 mg Oral Q6H PRN Theressa Millard, MD       Or  . acetaminophen (TYLENOL) suppository 650 mg  650 mg Rectal Q6H PRN Theressa Millard, MD      . alum & mag hydroxide-simeth (MAALOX/MYLANTA) 200-200-20 MG/5ML suspension 30 mL  30 mL Oral Q6H PRN Theressa Millard, MD      . aspirin EC tablet 325 mg  325 mg Oral Daily  Theressa Millard, MD   325 mg at 12/24/14 2307  . enoxaparin (LOVENOX) injection 40 mg  40 mg Subcutaneous Q24H Theressa Millard, MD   40 mg at 12/24/14 2343  . HYDROmorphone (DILAUDID) injection 0.5-1 mg  0.5-1 mg Intravenous Q3H PRN Theressa Millard, MD   0.5 mg at 12/25/14 0736  . ondansetron (ZOFRAN) tablet 4 mg  4 mg Oral Q6H PRN Theressa Millard, MD       Or  . ondansetron (ZOFRAN) injection 4 mg  4 mg Intravenous Q6H PRN Theressa Millard, MD      . oxyCODONE (Oxy IR/ROXICODONE) immediate release tablet 5 mg  5 mg Oral Q4H PRN Theressa Millard, MD      . sodium chloride 0.9 % injection 3 mL  3 mL Intravenous Q12H Theressa Millard, MD   0 mL at 12/24/14 2308  . sodium chloride 0.9 % injection 3 mL  3 mL Intravenous Q12H Theressa Millard, MD   3 mL at 12/24/14 2308  . sodium chloride 0.9 % injection 3 mL  3 mL Intravenous PRN Harvette  Evonnie Dawes, MD         No Known Allergies   History reviewed. No pertinent past medical history.  History reviewed. No pertinent past surgical history.  History reviewed. No pertinent family history.  Social History:  reports that he has been smoking Cigarettes.  He has been smoking about 0.30 packs per day. He has never used smokeless tobacco. He reports that he does not drink alcohol. His drug history is not on file.   Review of Systems: Constitutional:  denies fever, chills, diaphoresis, appetite change and fatigue.  HEENT: denies photophobia, eye pain, redness, hearing loss, ear pain, congestion, sore throat, rhinorrhea, sneezing, neck pain, neck stiffness and tinnitus.  Respiratory: denies SOB, DOE, cough, chest tightness, and wheezing.  Cardiovascular: admits to chest pain   Gastrointestinal: admits to indigestion and gas pain on occasion   Genitourinary: denies dysuria, urgency, frequency, hematuria, flank pain and difficulty urinating.  Musculoskeletal: denies  myalgias, back pain, joint swelling, arthralgias and gait problem.     Skin: denies pallor, rash and wound.  Neurological: denies dizziness, seizures, syncope, weakness, light-headedness, numbness and headaches.   Hematological: denies adenopathy, easy bruising, personal or family bleeding history.  Psychiatric/ Behavioral: denies suicidal ideation, mood changes, confusion, nervousness, sleep disturbance and agitation.    Physical Exam: BP 106/69 mmHg  Pulse 77  Temp(Src) 98.4 F (36.9 C) (Oral)  Resp 18  Ht 5\' 6"  (1.676 m)  Wt 168 lb 6.4 oz (76.386 kg)  BMI 27.19 kg/m2  SpO2 99%  Wt Readings from Last 3 Encounters:  12/25/14 168 lb 6.4 oz (76.386 kg)  03/21/13 155 lb (70.308 kg)  10/14/10 154 lb (69.854 kg)    General: Vital signs reviewed and noted. Well-developed, well-nourished, in no acute distress; alert,   Head: Normocephalic, atraumatic, sclera anicteric,   Neck: Supple. Negative for carotid bruits. No JVD   Lungs:  Clear bilaterally, no  wheezes, rales, or rhonchi. Breathing is normal   Heart: RRR with S1 S2. No murmurs, rubs, or gallops   Abdomen:  Soft, non-tender, non-distended with normoactive bowel sounds. No hepatomegaly. No rebound/guarding. No obvious abdominal masses   MSK: Strength and the appear normal for age.   Extremities: No clubbing or cyanosis. No edema.  Distal pedal pulses are 2+ and equal   Neurologic: Alert and oriented X 3. Moves all extremities spontaneously.  Psych: Responds to questions appropriately with a normal affect.     Lab results: Basic Metabolic Panel:  Recent Labs Lab 12/24/14 1719 12/25/14 0514  NA 138 140  K 3.8 4.1  CL 106 110  CO2 23 22  GLUCOSE 130* 95  BUN 11 11  CREATININE 1.17 1.05  CALCIUM 9.3 8.5    Liver Function Tests: No results for input(s): AST, ALT, ALKPHOS, BILITOT, PROT, ALBUMIN in the last 168 hours. No results for input(s): LIPASE, AMYLASE in the last 168 hours. No results for input(s): AMMONIA in the last 168 hours.  CBC:  Recent Labs Lab 12/24/14 1719  12/25/14 0514  WBC 4.5 4.0  HGB 13.8 12.9*  HCT 43.0 42.2  MCV 76.1* 75.6*  PLT 167 143*    Cardiac Enzymes:  Recent Labs Lab 12/24/14 2002 12/24/14 2239 12/25/14 0514  TROPONINI <0.03 <0.03 <0.03    BNP: Invalid input(s): POCBNP  CBG: No results for input(s): GLUCAP in the last 168 hours.  Coagulation Studies: No results for input(s): LABPROT, INR in the last 72 hours.   Other results:  EKG :    NSR,  LAFB,  No ST or T wave changes.    Imaging: Dg Chest 2 View  12/24/2014   CLINICAL DATA:  Shortness of breath and chest pain  EXAM: CHEST  2 VIEW  COMPARISON:  06/04/2007  FINDINGS: The heart size and mediastinal contours are within normal limits. Both lungs are clear. The visualized skeletal structures are unremarkable.  IMPRESSION: No active cardiopulmonary disease.   Electronically Signed   By: Inez Catalina M.D.   On: 12/24/2014 19:18        Assessment & Plan:  1. Chest pain: The patient presents with chest pain/tightness.  He thinks the pain was more like a gas pain but he did have significant chest tightness for a prolonged period of time. The pains seem to be worse with exercise.  He's currently pain-free.  As a history of hyperlipidemia and history of cigarette smoking.. I think our best option is to proceed with a stress Myoview study.  He had a normal stress Myoview study 4 years ago.   Thayer Headings, Brooke Bonito., MD, [facility] 12/25/2014, 8:44 AM Office - [phone] Pager [phone]

## 2014-12-25 NOTE — Plan of Care (Signed)
Problem: Phase II Progression Outcomes Goal: Anginal pain relieved Patient having 8/10 chest pain without activity.  Pain medication given with relief.  Will continue to monitor.  Sanda Linger

## 2014-12-26 LAB — BASIC METABOLIC PANEL
ANION GAP: 11 (ref 5–15)
BUN: 11 mg/dL (ref 6–23)
CALCIUM: 9.2 mg/dL (ref 8.4–10.5)
CO2: 21 mmol/L (ref 19–32)
Chloride: 106 mEq/L (ref 96–112)
Creatinine, Ser: 1.12 mg/dL (ref 0.50–1.35)
GFR calc Af Amer: 83 mL/min — ABNORMAL LOW (ref >90)
GFR, EST NON AFRICAN AMERICAN: 72 mL/min — AB (ref >90)
Glucose, Bld: 96 mg/dL (ref 70–99)
Potassium: 4.1 mmol/L (ref 3.5–5.1)
SODIUM: 138 mmol/L (ref 135–145)

## 2014-12-26 LAB — CBC
HCT: 44 % (ref 39.0–52.0)
Hemoglobin: 13.5 g/dL (ref 13.0–17.0)
MCH: 23 pg — AB (ref 26.0–34.0)
MCHC: 30.7 g/dL (ref 30.0–36.0)
MCV: 75.1 fL — ABNORMAL LOW (ref 78.0–100.0)
PLATELETS: 165 10*3/uL (ref 150–400)
RBC: 5.86 MIL/uL — AB (ref 4.22–5.81)
RDW: 13 % (ref 11.5–15.5)
WBC: 3.8 10*3/uL — ABNORMAL LOW (ref 4.0–10.5)

## 2014-12-26 LAB — TROPONIN I

## 2014-12-26 MED ORDER — ASPIRIN 81 MG PO TABS: 81.0000 mg | ORAL_TABLET | Freq: Every day | ORAL | Status: DC

## 2014-12-26 MED ORDER — PANTOPRAZOLE SODIUM 40 MG PO TBEC: 40.0000 mg | DELAYED_RELEASE_TABLET | Freq: Every day | ORAL | Status: DC

## 2014-12-26 NOTE — Progress Notes (Signed)
Reviewed discharge instructions with patient and he stated his understanding.  Discharged home with son. Larry Martin

## 2014-12-26 NOTE — Discharge Summary (Signed)
Larry Martin, 58 y.o., DOB October 05, 1956, MRN [id number]. Admission date: 12/24/2014 Discharge Date 12/26/2014 Primary MD No primary care provider on file. Admitting Physician Theressa Millard, MD  Admission Diagnosis  Chest pain [R07.9]  Discharge Diagnosis   Principal Problem:   Chest pain Active Problems:   Hyperlipidemia      History reviewed. No pertinent past medical history.  History reviewed. No pertinent past surgical history.   Hospital Course See H&P, Labs, Consult and Test reports for all details in brief, patient was admitted for  Principal Problem:   Chest pain Active Problems:   Hyperlipidemia  Admission history of present illness/brief narrative:  Larry Martin is a 58 y.o. male with a history of Hyperlipidemia who presents with complaints of intermittent chest pain described as pressure like and as heaviness in the substernal chest for 1 week. The pain is associated with SOB and occasionally diaphoresis and lasts for minutes at a time. He rates the pain at the worse has been a 7/10. He denies any nausea or vomiting. The pain occurs with exertion. As well had some musculoskeletal quality as it was reproducible by palpation,He had negative troponins 3, no acute EKG changes, went for stress test on 12/28, finding significant for small area of mild reversibility involving the apical segment of the anterior wall , which was thought by cardiology to be of clinical insignificant, patient was observed for another 24 hours, given he had an episode of muscular skeletal chest pain overnight, repeat EKG was nonacute, patient continues to have negative troponins.  Consults  Cardiology  Significant Tests:  See full reports for all details    Dg Chest 2 View  12/24/2014   CLINICAL DATA:  Shortness of breath and chest pain  EXAM: CHEST  2 VIEW  COMPARISON:  06/04/2007  FINDINGS: The heart size and mediastinal contours are within normal limits. Both lungs are clear.  The visualized skeletal structures are unremarkable.  IMPRESSION: No active cardiopulmonary disease.   Electronically Signed   By: Inez Catalina M.D.   On: 12/24/2014 19:18   Nm Myocar Multi W/spect W/wall Motion / Ef  12/25/2014   CLINICAL DATA:  Shortness of Breath and chest pain  EXAM: MYOCARDIAL IMAGING WITH SPECT (REST AND PHARMACOLOGIC-STRESS)  GATED LEFT VENTRICULAR WALL MOTION STUDY  LEFT VENTRICULAR EJECTION FRACTION  TECHNIQUE: Standard myocardial SPECT imaging was performed after resting intravenous injection of 10 mCi Tc-75m sestamibi. Subsequently, intravenous infusion of Lexiscan was performed under the supervision of the Cardiology staff. At peak effect of the drug, 30 mCi Tc-42m sestamibi was injected intravenously and standard myocardial SPECT imaging was performed. Quantitative gated imaging was also performed to evaluate left ventricular wall motion, and estimate left ventricular ejection fraction.  COMPARISON:  None.  FINDINGS: Perfusion: There is a small area of mild reversibility involving the apical segment of the anterior wall.  Wall Motion: Normal left ventricular wall motion. No left ventricular dilation.  Left Ventricular Ejection Fraction: 62 %  End diastolic volume 81 ml  End systolic volume 31 ml  IMPRESSION: 1. Small area of reversibility is identified involving the apical segment of the anterior wall.  2. Normal left ventricular wall motion.  3. Left ventricular ejection fraction 62%  4. Low-risk stress test findings*.  These results will be called to the ordering clinician or representative by the Radiologist Assistant, and communication documented in the PACS or zVision Dashboard.  *2012 Appropriate Use Criteria for Coronary Revascularization Focused Update: J Am Coll Cardiol. 3532;99(2):426-834. http://content.airportbarriers.com.aspx?articleid=1201161  Electronically Signed   By: Kerby Moors M.D.   On: 12/25/2014 16:39     Today   Subjective:   Larry Martin  today has no headache,no chest abdominal pain,no new weakness tingling or numbness, feels much better wants to go home today.   Objective:   Blood pressure 106/86, pulse 85, temperature 97.8 F (36.6 C), temperature source Oral, resp. rate 18, height 5\' 6"  (1.676 m), weight 76.3 kg (168 lb 3.4 oz), SpO2 98 %.  Intake/Output Summary (Last 24 hours) at 12/26/14 1032 Last data filed at 12/26/14 0829  Gross per 24 hour  Intake    603 ml  Output    300 ml  Net    303 ml    Exam Awake Alert, Oriented *3, No new F.N deficits, Normal affect [place].AT,PERRAL Supple Neck,No JVD, No cervical lymphadenopathy appriciated.  Symmetrical Chest wall movement, Good air movement bilaterally, CTAB RRR,No Gallops,Rubs or new Murmurs, No Parasternal Heave +ve B.Sounds, Abd Soft, Non tender, No organomegaly appriciated, No rebound -guarding or rigidity. No Cyanosis, Clubbing or edema, No new Rash or bruise  Data Review     CBC w Diff: Lab Results  Component Value Date   WBC 3.8* 12/26/2014   HGB 13.5 12/26/2014   HCT 44.0 12/26/2014   PLT 165 12/26/2014   LYMPHOPCT 11* 11/06/2008   MONOPCT 6 11/06/2008   EOSPCT 0 11/06/2008   BASOPCT 0 11/06/2008   CMP: Lab Results  Component Value Date   NA 138 12/26/2014   K 4.1 12/26/2014   CL 106 12/26/2014   CO2 21 12/26/2014   BUN 11 12/26/2014   CREATININE 1.12 12/26/2014   PROT 6.5 11/06/2008   ALBUMIN 3.9 11/06/2008   BILITOT 0.7 11/06/2008   ALKPHOS 27* 11/06/2008   AST 21 11/06/2008   ALT 17 11/06/2008  .  Micro Results No results found for this or any previous visit (from the past 240 hour(s)).   Discharge Instructions      Discharge Medications     Medication List    STOP taking these medications        meloxicam 15 MG tablet  Commonly known as:  MOBIC      TAKE these medications        aspirin 81 MG tablet  Take 1 tablet (81 mg total) by mouth daily.     pantoprazole 40 MG tablet  Commonly known as:  PROTONIX  Take 1  tablet (40 mg total) by mouth daily.         Total Time in preparing paper work, data evaluation and todays exam - 35 minutes  Freida Nebel M.D on 12/26/2014 at 10:32 AM  [facility] Office  [phone]

## 2014-12-26 NOTE — Progress Notes (Signed)
Some discomfort overnight, better today. Normal physical exam. Reassuringly normal stress test. Empirical PPI for likely GI source of pain.

## 2014-12-26 NOTE — Discharge Instructions (Signed)
Follow with Primary MD  in 7 days   Get CBC, CMP, 2 view Chest X ray checked  by Primary MD next visit.    Activity: As tolerated with Full fall precautions use walker/cane & assistance as needed   Disposition Home   Diet: Heart Healthy , with feeding assistance and aspiration precautions as needed.     On your next visit with your primary care physician please Get Medicines reviewed and adjusted.   Please request your Prim.MD to go over all Hospital Tests and Procedure/Radiological results at the follow up, please get all Hospital records sent to your Prim MD by signing hospital release before you go home.   If you experience worsening of your admission symptoms, develop shortness of breath, life threatening emergency, suicidal or homicidal thoughts you must seek medical attention immediately by calling 911 or calling your MD immediately  if symptoms less severe.  You Must read complete instructions/literature along with all the possible adverse reactions/side effects for all the Medicines you take and that have been prescribed to you. Take any new Medicines after you have completely understood and accpet all the possible adverse reactions/side effects.   Do not drive, operating heavy machinery, perform activities at heights, swimming or participation in water activities or provide baby sitting services if your were admitted for syncope or siezures until you have seen by Primary MD or a Neurologist and advised to do so again.  Do not drive when taking Pain medications.    Do not take more than prescribed Pain, Sleep and Anxiety Medications  Special Instructions: If you have smoked or chewed Tobacco  in the last 2 yrs please stop smoking, stop any regular Alcohol  and or any Recreational drug use.  Wear Seat belts while driving.   Please note  You were cared for by a hospitalist during your hospital stay. If you have any questions about your discharge medications or the care  you received while you were in the hospital after you are discharged, you can call the unit and asked to speak with the hospitalist on call if the hospitalist that took care of you is not available. Once you are discharged, your primary care physician will handle any further medical issues. Please note that NO REFILLS for any discharge medications will be authorized once you are discharged, as it is imperative that you return to your primary care physician (or establish a relationship with a primary care physician if you do not have one) for your aftercare needs so that they can reassess your need for medications and monitor your lab values.     Chest Wall Pain Chest wall pain is pain in or around the bones and muscles of your chest. It may take up to 6 weeks to get better. It may take longer if you must stay physically active in your work and activities.  CAUSES  Chest wall pain may happen on its own. However, it may be caused by:  A viral illness like the flu.  Injury.  Coughing.  Exercise.  Arthritis.  Fibromyalgia.  Shingles. HOME CARE INSTRUCTIONS   Avoid overtiring physical activity. Try not to strain or perform activities that cause pain. This includes any activities using your chest or your abdominal and side muscles, especially if heavy weights are used.  Put ice on the sore area.  Put ice in a plastic bag.  Place a towel between your skin and the bag.  Leave the ice on for 15-20 minutes per hour  while awake for the first 2 days.  Only take over-the-counter or prescription medicines for pain, discomfort, or fever as directed by your caregiver. SEEK IMMEDIATE MEDICAL CARE IF:   Your pain increases, or you are very uncomfortable.  You have a fever.  Your chest pain becomes worse.  You have new, unexplained symptoms.  You have nausea or vomiting.  You feel sweaty or lightheaded.  You have a cough with phlegm (sputum), or you cough up blood. MAKE SURE YOU:    Understand these instructions.  Will watch your condition.  Will get help right away if you are not doing well or get worse. Document Released: 12/15/2005 Document Revised: 03/08/2012 Document Reviewed: 08/11/2011 [facility] Patient Information 2015 [place], [place]. This information is not intended to replace advice given to you by your health care provider. Make sure you discuss any questions you have with your health care provider.  Musculoskeletal Pain Musculoskeletal pain is muscle and boney aches and pains. These pains can occur in any part of the body. Your caregiver may treat you without knowing the cause of the pain. They may treat you if blood or urine tests, X-rays, and other tests were normal.  CAUSES There is often not a definite cause or reason for these pains. These pains may be caused by a type of germ (virus). The discomfort may also come from overuse. Overuse includes working out too hard when your body is not fit. Boney aches also come from weather changes. Bone is sensitive to atmospheric pressure changes. HOME CARE INSTRUCTIONS   Ask when your test results will be ready. Make sure you get your test results.  Only take over-the-counter or prescription medicines for pain, discomfort, or fever as directed by your caregiver. If you were given medications for your condition, do not drive, operate machinery or power tools, or sign legal documents for 24 hours. Do not drink alcohol. Do not take sleeping pills or other medications that may interfere with treatment.  Continue all activities unless the activities cause more pain. When the pain lessens, slowly resume normal activities. Gradually increase the intensity and duration of the activities or exercise.  During periods of severe pain, bed rest may be helpful. Lay or sit in any position that is comfortable.  Putting ice on the injured area.  Put ice in a bag.  Place a towel between your skin and the bag.  Leave the ice  on for 15 to 20 minutes, 3 to 4 times a day.  Follow up with your caregiver for continued problems and no reason can be found for the pain. If the pain becomes worse or does not go away, it may be necessary to repeat tests or do additional testing. Your caregiver may need to look further for a possible cause. SEEK IMMEDIATE MEDICAL CARE IF:  You have pain that is getting worse and is not relieved by medications.  You develop chest pain that is associated with shortness or breath, sweating, feeling sick to your stomach (nauseous), or throw up (vomit).  Your pain becomes localized to the abdomen.  You develop any new symptoms that seem different or that concern you. MAKE SURE YOU:   Understand these instructions.  Will watch your condition.  Will get help right away if you are not doing well or get worse. Document Released: 12/15/2005 Document Revised: 03/08/2012 Document Reviewed: 08/19/2013 [facility] Patient Information 2015 [place], [place]. This information is not intended to replace advice given to you by your health care provider. Make  sure you discuss any questions you have with your health care provider.

## 2016-03-05 ENCOUNTER — Ambulatory Visit (INDEPENDENT_AMBULATORY_CARE_PROVIDER_SITE_OTHER): Payer: 59

## 2016-03-05 ENCOUNTER — Ambulatory Visit (INDEPENDENT_AMBULATORY_CARE_PROVIDER_SITE_OTHER): Payer: 59 | Admitting: Family Medicine

## 2016-03-05 VITALS — BP 118/80 | HR 92 | Temp 99.4°F | Resp 17 | Ht 69.5 in | Wt 168.0 lb

## 2016-03-05 DIAGNOSIS — R509 Fever, unspecified: Secondary | ICD-10-CM

## 2016-03-05 DIAGNOSIS — R05 Cough: Secondary | ICD-10-CM | POA: Diagnosis not present

## 2016-03-05 DIAGNOSIS — F17200 Nicotine dependence, unspecified, uncomplicated: Secondary | ICD-10-CM

## 2016-03-05 DIAGNOSIS — R0989 Other specified symptoms and signs involving the circulatory and respiratory systems: Secondary | ICD-10-CM

## 2016-03-05 DIAGNOSIS — J111 Influenza due to unidentified influenza virus with other respiratory manifestations: Secondary | ICD-10-CM

## 2016-03-05 DIAGNOSIS — R0689 Other abnormalities of breathing: Secondary | ICD-10-CM

## 2016-03-05 LAB — POCT INFLUENZA A/B
Influenza A, POC: POSITIVE — AB
Influenza B, POC: NEGATIVE

## 2016-03-05 MED ORDER — OSELTAMIVIR PHOSPHATE 75 MG PO CAPS: 75.0000 mg | ORAL_CAPSULE | Freq: Two times a day (BID) | ORAL | Status: DC

## 2016-03-05 MED ORDER — ALBUTEROL SULFATE (2.5 MG/3ML) 0.083% IN NEBU
2.5000 mg | INHALATION_SOLUTION | Freq: Once | RESPIRATORY_TRACT | Status: AC
  Administered 2016-03-05: 2.5 mg via RESPIRATORY_TRACT

## 2016-03-05 MED ORDER — HYDROCOD POLST-CPM POLST ER 10-8 MG/5ML PO SUER: 5.0000 mL | Freq: Two times a day (BID) | ORAL | Status: DC | PRN

## 2016-03-05 MED ORDER — IPRATROPIUM BROMIDE 0.02 % IN SOLN
0.5000 mg | Freq: Once | RESPIRATORY_TRACT | Status: AC
Start: 2016-03-05 — End: 2016-03-05
  Administered 2016-03-05: 0.5 mg via RESPIRATORY_TRACT

## 2016-03-05 MED FILL — HYDROCODONE-CHLORPHENIRAM S: 10-8 | 12 days supply | Qty: 120 | Fill #0

## 2016-03-05 MED FILL — OSELTAMIVIR PHOS 75 MG CAP: 75 | 5 days supply | Qty: 10 | Fill #0

## 2016-03-05 NOTE — Patient Instructions (Addendum)
Because you received an x-ray today, you will receive an invoice from [facility]. Please contact [facility] at [phone] with questions or concerns regarding your invoice. Our billing staff will not be able to assist you with those questions.   Influenza, Adult Influenza ("the flu") is a viral infection of the respiratory tract. It occurs more often in winter months because people spend more time in close contact with one another. Influenza can make you feel very sick. Influenza easily spreads from person to person (contagious). CAUSES  Influenza is caused by a virus that infects the respiratory tract. You can catch the virus by breathing in droplets from an infected person's cough or sneeze. You can also catch the virus by touching something that was recently contaminated with the virus and then touching your mouth, nose, or eyes. RISKS AND COMPLICATIONS You may be at risk for a more severe case of influenza if you smoke cigarettes, have diabetes, have chronic heart disease (such as heart failure) or lung disease (such as asthma), or if you have a weakened immune system. Elderly people and pregnant women are also at risk for more serious infections. The most common problem of influenza is a lung infection (pneumonia). Sometimes, this problem can require emergency medical care and may be life threatening. SIGNS AND SYMPTOMS  Symptoms typically last 4 to 10 days and may include:  Fever.  Chills.  Headache, body aches, and muscle aches.  Sore throat.  Chest discomfort and cough.  Poor appetite.  Weakness or feeling tired.  Dizziness.  Nausea or vomiting. DIAGNOSIS  Diagnosis of influenza is often made based on your history and a physical exam. A nose or throat swab test can be done to confirm the diagnosis. TREATMENT  In mild cases, influenza goes away on its own. Treatment is directed at relieving symptoms. For more severe cases, your health care provider may  prescribe antiviral medicines to shorten the sickness. Antibiotic medicines are not effective because the infection is caused by a virus, not by bacteria. HOME CARE INSTRUCTIONS  Take medicines only as directed by your health care provider.  Use a cool mist humidifier to make breathing easier.  Get plenty of rest until your temperature returns to normal. This usually takes 3 to 4 days.  Drink enough fluid to keep your urine clear or pale yellow.  Cover yourmouth and nosewhen coughing or sneezing,and wash your handswellto prevent thevirusfrom spreading.  Stay homefromwork orschool untilthe fever is gonefor at least 5full day. PREVENTION  An annual influenza vaccination (flu shot) is the best way to avoid getting influenza. An annual flu shot is now routinely recommended for all adults in the [place] IF:  You experiencechest pain, yourcough worsens,or you producemore mucus.  Youhave nausea,vomiting, ordiarrhea.  Your fever returns or gets worse. SEEK IMMEDIATE MEDICAL CARE IF:  You havetrouble breathing, you become short of breath,or your skin ornails becomebluish.  You have severe painor stiffnessin the neck.  You develop a sudden headache, or pain in the face or ear.  You have nausea or vomiting that you cannot control. MAKE SURE YOU:   Understand these instructions.  Will watch your condition.  Will get help right away if you are not doing well or get worse.   This information is not intended to replace advice given to you by your health care provider. Make sure you discuss any questions you have with your health care provider.   Document Released: 12/12/2000 Document Revised: 01/05/2015 Document Reviewed: 03/15/2012  Chartered certified accountant Patient Education [employer].

## 2016-03-05 NOTE — Progress Notes (Signed)
Subjective:  By signing my name below, I, [name], attest that this documentation has been prepared under the direction and in the presence of Delman Cheadle, MD Electronically Signed: Ladene Artist, ED Scribe 03/05/2016 at 11:33 AM.   Patient ID: Larry Martin, male    DOB: [birth year], 59 y.o.   MRN: [id number]  Chief Complaint  Patient presents with  . Cough  . Fever  . Nasal Congestion  . Chills   HPI HPI Comments: Larry Martin is a 60 y.o. male who presents to the [facility] complaining of gradually worsening cough onset 5 days ago. Pt reports associated fever, chills, nasal congestion. He has tried Robitussin and ibuprofen without significant releif. He denies SOB and any other symptoms at this time. Pt is a nonsmoker. No pulmonary hx. Pt works at [employer] and has received his flu vaccine.   No past medical history on file. No current outpatient prescriptions on file prior to visit.   No current facility-administered medications on file prior to visit.   No Known Allergies  Review of Systems  Constitutional: Positive for fever, chills, activity change and appetite change.  HENT: Positive for congestion, postnasal drip and rhinorrhea. Negative for ear pain, sinus pressure, sore throat, trouble swallowing and voice change.   Respiratory: Positive for cough. Negative for chest tightness, shortness of breath and wheezing.   Cardiovascular: Negative for chest pain.  Hematological: Positive for adenopathy.   BP 118/80 mmHg  Pulse 92  Temp(Src) 99.4 F (37.4 C) (Oral)  Resp 17  Ht 5' 9.5" (1.765 m)  Wt 168 lb (76.204 kg)  BMI 24.46 kg/m2  SpO2 98%    Objective:   Physical Exam  Constitutional: He is oriented to person, place, and time. He appears well-developed and well-nourished. No distress.  HENT:  Head: Normocephalic and atraumatic.  Right Ear: Tympanic membrane normal.  Left Ear: Tympanic membrane normal.  Nose: Mucosal edema  present.  Mouth/Throat: Posterior oropharyngeal erythema present.  Nasal erythema.   Eyes: Conjunctivae and EOM are normal.  Neck: Neck supple. No tracheal deviation present.  Cardiovascular: Regular rhythm, S1 normal, S2 normal and normal heart sounds.  Tachycardia present.   No murmur heard. Pulmonary/Chest: Effort normal. No respiratory distress.  Decreased breath sounds in the R lower lobe.   Musculoskeletal: Normal range of motion.  Lymphadenopathy:    He has cervical adenopathy (anterior - bilaterally).  Neurological: He is alert and oriented to person, place, and time.  Skin: Skin is warm and dry.  Psychiatric: He has a normal mood and affect. His behavior is normal.  Nursing note and vitals reviewed.     Assessment & Plan:   1. Fever, unspecified   2. Decreased breath sounds   3. Tobacco use disorder   4. Influenza with respiratory manifestation     Orders Placed This Encounter  Procedures  . DG Chest 2 View    Standing Status: Future     Number of Occurrences: 1     Standing Expiration Date: 03/05/2017    Order Specific Question:  Reason for Exam (SYMPTOM  OR DIAGNOSIS REQUIRED)    Answer:  flu-like sxs, decreased breath sounds in the right lower lobe, smoker    Order Specific Question:  Preferred imaging location?    Answer:  External  . POCT Influenza A/B    Meds ordered this encounter  Medications  . ibuprofen (ADVIL,MOTRIN) 200 MG tablet    Sig: Take 200 mg by  mouth every 6 (six) hours as needed.  Marland Kitchen guaiFENesin (ROBITUSSIN) 100 MG/5ML liquid    Sig: Take 200 mg by mouth 3 (three) times daily as needed for cough.  Marland Kitchen albuterol (PROVENTIL) (2.5 MG/3ML) 0.083% nebulizer solution 2.5 mg    Sig:   . ipratropium (ATROVENT) nebulizer solution 0.5 mg    Sig:   . oseltamivir (TAMIFLU) 75 MG capsule    Sig: Take 1 capsule (75 mg total) by mouth 2 (two) times daily.    Dispense:  10 capsule    Refill:  0  . chlorpheniramine-HYDROcodone (TUSSIONEX PENNKINETIC ER)  10-8 MG/5ML SUER    Sig: Take 5 mLs by mouth every 12 (twelve) hours as needed.    Dispense:  120 mL    Refill:  0    I personally performed the services described in this documentation, which was scribed in my presence. The recorded information has been reviewed and considered, and addended by me as needed.  Delman Cheadle, MD MPH

## 2016-03-15 DIAGNOSIS — H52223 Regular astigmatism, bilateral: Secondary | ICD-10-CM | POA: Diagnosis not present

## 2016-03-15 DIAGNOSIS — H2513 Age-related nuclear cataract, bilateral: Secondary | ICD-10-CM | POA: Diagnosis not present

## 2016-03-15 DIAGNOSIS — H5203 Hypermetropia, bilateral: Secondary | ICD-10-CM | POA: Diagnosis not present

## 2016-03-15 DIAGNOSIS — H524 Presbyopia: Secondary | ICD-10-CM | POA: Diagnosis not present

## 2016-06-25 DIAGNOSIS — L723 Sebaceous cyst: Secondary | ICD-10-CM | POA: Diagnosis not present

## 2016-10-08 DIAGNOSIS — R5383 Other fatigue: Secondary | ICD-10-CM | POA: Diagnosis not present

## 2016-10-08 DIAGNOSIS — M545 Low back pain: Secondary | ICD-10-CM | POA: Diagnosis not present

## 2016-10-13 ENCOUNTER — Ambulatory Visit
Admission: RE | Admit: 2016-10-13 | Discharge: 2016-10-13 | Disposition: A | Discharge status: Home / Self Care | Payer: 59 | Source: Ambulatory Visit | Attending: Nurse Practitioner | Admitting: Nurse Practitioner

## 2016-10-13 ENCOUNTER — Other Ambulatory Visit: Payer: Self-pay | Admitting: Nurse Practitioner

## 2016-10-13 DIAGNOSIS — M5440 Lumbago with sciatica, unspecified side: Secondary | ICD-10-CM

## 2016-10-13 DIAGNOSIS — K5900 Constipation, unspecified: Secondary | ICD-10-CM | POA: Diagnosis not present

## 2017-12-29 DIAGNOSIS — Z8619 Personal history of other infectious and parasitic diseases: Secondary | ICD-10-CM

## 2017-12-29 HISTORY — DX: Personal history of other infectious and parasitic diseases: Z86.19

## 2018-05-05 DIAGNOSIS — Z125 Encounter for screening for malignant neoplasm of prostate: Secondary | ICD-10-CM | POA: Diagnosis not present

## 2018-05-05 DIAGNOSIS — Z720 Tobacco use: Secondary | ICD-10-CM | POA: Diagnosis not present

## 2018-05-05 DIAGNOSIS — K5900 Constipation, unspecified: Secondary | ICD-10-CM | POA: Diagnosis not present

## 2018-05-05 DIAGNOSIS — Z0000 Encounter for general adult medical examination without abnormal findings: Secondary | ICD-10-CM | POA: Diagnosis not present

## 2018-05-05 DIAGNOSIS — R6881 Early satiety: Secondary | ICD-10-CM | POA: Diagnosis not present

## 2018-05-05 DIAGNOSIS — Z1211 Encounter for screening for malignant neoplasm of colon: Secondary | ICD-10-CM | POA: Diagnosis not present

## 2018-05-14 MED FILL — VIT D2 1.25 MG (50,000 UNIT: 1.25 MG | 84 days supply | Qty: 24 | Fill #0

## 2018-06-18 ENCOUNTER — Encounter: Payer: Self-pay | Admitting: Nurse Practitioner

## 2018-06-29 ENCOUNTER — Encounter: Payer: Self-pay | Admitting: Gastroenterology

## 2018-09-01 ENCOUNTER — Encounter: Payer: Self-pay | Admitting: Gastroenterology

## 2018-09-01 ENCOUNTER — Ambulatory Visit (INDEPENDENT_AMBULATORY_CARE_PROVIDER_SITE_OTHER): Payer: 59 | Admitting: Gastroenterology

## 2018-09-01 ENCOUNTER — Telehealth: Payer: Self-pay | Admitting: *Deleted

## 2018-09-01 VITALS — BP 110/76 | HR 81 | Ht 66.0 in | Wt 172.0 lb

## 2018-09-01 DIAGNOSIS — Z1211 Encounter for screening for malignant neoplasm of colon: Secondary | ICD-10-CM | POA: Insufficient documentation

## 2018-09-01 DIAGNOSIS — R12 Heartburn: Secondary | ICD-10-CM | POA: Diagnosis not present

## 2018-09-01 DIAGNOSIS — K5900 Constipation, unspecified: Secondary | ICD-10-CM | POA: Diagnosis not present

## 2018-09-01 DIAGNOSIS — R140 Abdominal distension (gaseous): Secondary | ICD-10-CM | POA: Diagnosis not present

## 2018-09-01 DIAGNOSIS — R6881 Early satiety: Secondary | ICD-10-CM | POA: Insufficient documentation

## 2018-09-01 MED ORDER — PEG 3350-KCL-NABCB-NACL-NASULF 236 G PO SOLR: 4000.0000 mL | Freq: Once | ORAL | 0 refills | Status: AC

## 2018-09-01 MED FILL — GAVILYTE-G SOLUTION: 236 | 1 days supply | Qty: 4000 | Fill #0

## 2018-09-01 NOTE — Patient Instructions (Signed)
Please purchase the following medications over the counter and take as directed: Miralax 1 capful daily and may increase to twice a day if necessary.   Fibercon once daily.   Zantac 150 mg once daily.   You have been scheduled for an endoscopy and colonoscopy. Please follow the written instructions given to you at your visit today. Please pick up your prep supplies at the pharmacy within the next 1-3 days. If you use inhalers (even only as needed), please bring them with you on the day of your procedure. Your physician has requested that you go to www.startemmi.com and enter the access code given to you at your visit today. This web site gives a general overview about your procedure. However, you should still follow specific instructions given to you by our office regarding your preparation for the procedure.

## 2018-09-01 NOTE — Telephone Encounter (Signed)
LM for Larry Martin at [facility], medical records. Adivsed they had referred this patient to [place].  Patient in office seeing Dr. Rush Landmark today.  Asking for the most recent labs and office note from Dr. Baird Cancer.

## 2018-09-01 NOTE — Progress Notes (Signed)
[facility] VISIT   Primary Care Provider Glendale Chard, [place] [address] [place] [address] [phone]  Referring Provider Glendale Chard, [place] [address] [place], [place] [address] [phone]  Patient Profile: Larry Martin is a 62 y.o. male with a pmh significant for HLD, Constipation, infrequent GERD.  The patient presents to the [facility] for an evaluation and management of problem(s) noted below:  Problem List 1. Early satiety   2. Bloating   3. Colon cancer screening   4. Constipation, unspecified constipation type   5. Pyrosis     History of Present Illness: This is the patient's first visit to the [facility] GI clinic.  He describes within the course the last year having the development of new onset constipation.  He describes constipation as hard stools with a Bristol scale of 1-2.  He has infrequently had to strain but at the time of his straining he has not noted any blood loss from his rectum.  He spoke with his primary care physician about this and describes being giving a sample of medication for constipation (query Linzess).  He describes the use of his medication as being very good to maintain good bowel movements.  He did not require this medication on a daily basis however.  His last episode of constipation was about 1 month ago.  He still has a few of the samples but when he was asked to try and get a refill it was cost prohibitive at almost $1000 for a month worth of medication.  He is previously trialed other medications such as magnesium citrate but no other laxatives per his report.  His weight has been stable.  Interestingly from other GI's perspective he has describe a symptom of new onset early satiety over the course the last 2 months.  He has been getting bloated.  He has infrequent nausea but has not vomited.  He has never had this sensation previously.  If he tries to eructate or  pass flatus he does not initially have improvement.  He will take him anywhere from a few hours at times for the bloating sensation and feeling as if his food is able to pass into his GI tract.  He has not ever been told that he has diabetes.  Thankfully has not had any significant weight loss.  He does have pyrosis with certain food intake however if he does not take that he will not necessarily have heartburn.  To try and help his symptoms of heartburn when they do occur should he eat incorrectly he will drink milk and that can soothe his heartburn and partial indigestion.  He describes not taking significant nonsteroidals on his medication list he is on Mobic.  He does not take aspirin or BC/Goody powders.  He has never had an upper or lower endoscopy.  GI Review of Systems Positive as above Negative for dysphasia, odynophagia, jaundice, melena  Review of Systems General: Denies fevers/chills/weight loss HEENT: Denies oral lesions Cardiovascular: Denies chest pain/palpitations Pulmonary: Denies shortness of breath Gastroenterological: See HPI Hematological: Denies easy bruising Endocrine: Denies temperature intolerance Dermatological: Denies skin rashes Psychological: Mood is stable Musculoskeletal: Denies new arthralgias   Medications Current Outpatient Medications  Medication Sig Dispense Refill  . ibuprofen (ADVIL,MOTRIN) 200 MG tablet Take 200 mg by mouth every 6 (six) hours as needed.    . polyethylene glycol (GOLYTELY) 236 g solution Take 4,000 mLs by mouth once for 1 dose. 4000 mL 0  No current facility-administered medications for this visit.    Allergies No Known Allergies  Histories History reviewed. No pertinent past medical history. History reviewed. No pertinent surgical history. Social History   Socioeconomic History  . Marital status: Married    Spouse name: Not on file  . Number of children: Not on file  . Years of education: Not on file  . Highest  education level: Not on file  Occupational History  . Not on file  Social Needs  . Financial resource strain: Not on file  . Food insecurity:    Worry: Not on file    Inability: Not on file  . Transportation needs:    Medical: Not on file    Non-medical: Not on file  Tobacco Use  . Smoking status: Current Every Day Smoker    Packs/day: 0.30    Years: 35.00    Pack years: 10.50    Types: Cigarettes  . Smokeless tobacco: Never Used  Substance and Sexual Activity  . Alcohol use: No    Alcohol/week: 0.0 standard drinks  . Drug use: Not on file  . Sexual activity: Not on file  Lifestyle  . Physical activity:    Days per week: Not on file    Minutes per session: Not on file  . Stress: Not on file  Relationships  . Social connections:    Talks on phone: Not on file    Gets together: Not on file    Attends religious service: Not on file    Active member of club or organization: Not on file    Attends meetings of clubs or organizations: Not on file    Relationship status: Not on file  . Intimate partner violence:    Fear of current or ex partner: Not on file    Emotionally abused: Not on file    Physically abused: Not on file    Forced sexual activity: Not on file  Other Topics Concern  . Not on file  Social History Narrative  . Not on file   History reviewed. No pertinent family history.  I have reviewed his medical, social, and family history in detail and updated the electronic medical record as necessary.    PHYSICAL EXAMINATION  BP 110/76   Pulse 81   Ht 5' 6" (1.676 m)   Wt 172 lb (78 kg)   BMI 27.76 kg/m  GEN: NAD, appears stated age, doesn't appear chronically ill PSYCH: Cooperative, without pressured speech EYE: Conjunctivae pink, sclerae anicteric ENT: MMM, without oral ulcers NECK: Supple CV: RR without R/Gs  RESP: CTAB posteriorly GI: NABS, soft, NT/ND, without rebound or guarding, no HSM appreciated GU: DRE deferred by patient MSK/EXT: No LE  edema SKIN: No jaundice  NEURO:  Alert & Oriented x 3, no focal deficits   REVIEW OF DATA  I reviewed the following data at the time of this encounter:  GI Procedures and Studies  No relevant studies  Laboratory Studies  Awaiting labs from PCP to be sent  Imaging Studies  No relevant studies   ASSESSMENT  Mr. Babler is a 62 y.o. male with a pmh significant for HLD, Constipation, infrequent GERD.  The patient is seen today for a return visit for evaluation and management of:  1. Early satiety   2. Bloating   3. Colon cancer screening   4. Constipation, unspecified constipation type   5. Pyrosis    The patient describes new onset early satiety with bloating and nausea of an  unclear etiology.  Although he is not having other significant constitutional symptoms in regards to significant weight loss or overt vomiting this has been a concerning new finding for the patient he is due for colon cancer screening as he is not had this previously performed.  He has had intermittent issues with constipation and was treated with sample pack of Linzess per his report with good effect.  I think it would be reasonable to obtain a CBC and CMP and TSH to ensure that there is not a metabolic issue ongoing causing his constipation.  I suspect that this is likely is a result of decreased water intake as well as a functional constipation however we will rule want to rule out obstructive lesions on his screening colonoscopy which he is agreed to have performed.  I think with his upper GI symptoms and GERD, albeit infrequent, that ruling out a structural etiology for the patient's symptoms would be reasonable and we discussed the procedure of an upper endoscopy as well.  The risks and benefits of endoscopic evaluation were discussed with the patient; these include but are not limited to the risk of perforation, infection, bleeding, missed lesions, lack of diagnosis, severe illness requiring hospitalization, as well  as anesthesia and sedation related illnesses.  The patient and wife are agreeable to proceed.  If he does not have a structural etiology for his symptoms early satiety then I would suggest that a hemoglobin A1c be obtained as well as a consideration based on his symptoms of a solid food gastric emptying study be performed in the future.  For some reason, even though the patient's PCP is part of [facility], we do not have access to his records and we will try to obtain those today.  All questions were answered to the best of my ability.   PLAN  1. Early satiety - Plan for small meals in interim (gastroparesis-like diet discussed) - EGD to be scheduled - Comp Met (CMET); Future - Consider solid food gastric emptying study in future if symptoms persist - Consider hemoglobin A1c evaluation  2. Bloating  3. Colon cancer screening - Colonoscopy to be scheduled - CBC with Differential/Platelet; Future - Iron Binding Cap (TIBC); Future - Iron; Future  4. Constipation, unspecified constipation type - Comp Met (CMET); Future - TSH; Future  5. Pyrosis -Oral diet/lifestyle modifications at this point in time we will hold off on medications unless persistent GERD symptoms occur  6. Other - Hepatitis C antibody, reflex; Future   Orders Placed This Encounter  Procedures  . Comp Met (CMET)  . CBC with Differential/Platelet  . TSH  . Iron Binding Cap (TIBC)  . Iron  . Hepatitis C antibody, reflex  . Ambulatory referral to Gastroenterology    New Prescriptions   POLYETHYLENE GLYCOL (GOLYTELY) 236 G SOLUTION    Take 4,000 mLs by mouth once for 1 dose.   Modified Medications   No medications on file    Planned Follow Up: No follow-ups on file.   Justice Britain, MD [facility] Office # [phone]

## 2018-09-29 ENCOUNTER — Encounter: Payer: Self-pay | Admitting: Gastroenterology

## 2018-10-12 ENCOUNTER — Encounter: Payer: Self-pay | Admitting: Gastroenterology

## 2018-10-12 ENCOUNTER — Ambulatory Visit (AMBULATORY_SURGERY_CENTER): Payer: 59 | Admitting: Gastroenterology

## 2018-10-12 VITALS — BP 116/53 | HR 73 | Temp 98.9°F | Resp 20 | Ht 66.0 in | Wt 172.0 lb

## 2018-10-12 DIAGNOSIS — Z1211 Encounter for screening for malignant neoplasm of colon: Secondary | ICD-10-CM | POA: Diagnosis not present

## 2018-10-12 DIAGNOSIS — K635 Polyp of colon: Secondary | ICD-10-CM

## 2018-10-12 DIAGNOSIS — K621 Rectal polyp: Secondary | ICD-10-CM

## 2018-10-12 DIAGNOSIS — K2970 Gastritis, unspecified, without bleeding: Secondary | ICD-10-CM | POA: Diagnosis not present

## 2018-10-12 DIAGNOSIS — B9681 Helicobacter pylori [H. pylori] as the cause of diseases classified elsewhere: Secondary | ICD-10-CM | POA: Diagnosis not present

## 2018-10-12 DIAGNOSIS — D127 Benign neoplasm of rectosigmoid junction: Secondary | ICD-10-CM | POA: Diagnosis not present

## 2018-10-12 DIAGNOSIS — D128 Benign neoplasm of rectum: Secondary | ICD-10-CM

## 2018-10-12 DIAGNOSIS — K219 Gastro-esophageal reflux disease without esophagitis: Secondary | ICD-10-CM | POA: Diagnosis not present

## 2018-10-12 DIAGNOSIS — K2950 Unspecified chronic gastritis without bleeding: Secondary | ICD-10-CM | POA: Diagnosis not present

## 2018-10-12 HISTORY — PX: COLONOSCOPY WITH ESOPHAGOGASTRODUODENOSCOPY (EGD): SHX5779

## 2018-10-12 MED ORDER — SODIUM CHLORIDE 0.9 % IV SOLN: 500.0000 mL | Freq: Once | INTRAVENOUS | Status: DC

## 2018-10-12 MED ORDER — OMEPRAZOLE 40 MG PO CPDR: 40.0000 mg | DELAYED_RELEASE_CAPSULE | Freq: Every day | ORAL | 4 refills | Status: DC

## 2018-10-12 NOTE — Patient Instructions (Signed)
YOU HAD AN ENDOSCOPIC PROCEDURE TODAY AT [facility]:   Refer to the procedure report that was given to you for any specific questions about what was found during the examination.  If the procedure report does not answer your questions, please call your gastroenterologist to clarify.  If you requested that your care partner not be given the details of your procedure findings, then the procedure report has been included in a sealed envelope for you to review at your convenience later.  YOU SHOULD EXPECT: Some feelings of bloating in the abdomen. Passage of more gas than usual.  Walking can help get rid of the air that was put into your GI tract during the procedure and reduce the bloating. If you had a lower endoscopy (such as a colonoscopy or flexible sigmoidoscopy) you may notice spotting of blood in your stool or on the toilet paper. If you underwent a bowel prep for your procedure, you may not have a normal bowel movement for a few days.  Please Note:  You might notice some irritation and congestion in your nose or some drainage.  This is from the oxygen used during your procedure.  There is no need for concern and it should clear up in a day or so.  SYMPTOMS TO REPORT IMMEDIATELY:   Following lower endoscopy (colonoscopy or flexible sigmoidoscopy):  Excessive amounts of blood in the stool  Significant tenderness or worsening of abdominal pains  Swelling of the abdomen that is new, acute  Fever of 100F or higher   Following upper endoscopy (EGD)  Vomiting of blood or coffee ground material  New chest pain or pain under the shoulder blades  Painful or persistently difficult swallowing  New shortness of breath  Fever of 100F or higher  Black, tarry-looking stools  For urgent or emergent issues, a gastroenterologist can be reached at any hour by calling [phone].   DIET:  We do recommend a small meal at first, but then you may proceed to your regular diet.  Drink  plenty of fluids but you should avoid alcoholic beverages for 24 hours.  ACTIVITY:  You should plan to take it easy for the rest of today and you should NOT DRIVE or use heavy machinery until tomorrow (because of the sedation medicines used during the test).    FOLLOW UP: Our staff will call the number listed on your records the next business day following your procedure to check on you and address any questions or concerns that you may have regarding the information given to you following your procedure. If we do not reach you, we will leave a message.  However, if you are feeling well and you are not experiencing any problems, there is no need to return our call.  We will assume that you have returned to your regular daily activities without incident.  If any biopsies were taken you will be contacted by phone or by letter within the next 1-3 weeks.  Please call us at [phone] if you have not heard about the biopsies in 3 weeks.   Await for biopsy results Start Omeprazole 40 mg daily for next 2-3 months Minimize NSAIDS High Fiber Diet (handout given) Hemorrhoid (handout given) Polyps (handout given) Office visit in 4-6 weeks   SIGNATURES/CONFIDENTIALITY: You and/or your care partner have signed paperwork which will be entered into your electronic medical record.  These signatures attest to the fact that that the information above on your After Visit Summary has been  reviewed and is understood.  Full responsibility of the confidentiality of this discharge information lies with you and/or your care-partner. 

## 2018-10-12 NOTE — Progress Notes (Signed)
Called to room to assist during endoscopic procedure.  Patient ID and intended procedure confirmed with present staff. Received instructions for my participation in the procedure from the performing physician.  

## 2018-10-12 NOTE — Op Note (Signed)
[facility] Patient Name: Larry Martin Procedure Date: 10/12/2018 2:45 PM MRN: [id number] Endoscopist: Justice Britain , MD Age: 62 Referring MD:  Date of Birth: [birth year] Gender: Male Account #: [id number] Procedure:                Upper GI endoscopy Indications:              Heartburn, Abdominal bloating, Early satiety,                            Weight loss Medicines:                Monitored Anesthesia Care Procedure:                Pre-Anesthesia Assessment:                           - Prior to the procedure, a History and Physical                            was performed, and patient medications and                            allergies were reviewed. The patient's tolerance of                            previous anesthesia was also reviewed. The risks                            and benefits of the procedure and the sedation                            options and risks were discussed with the patient.                            All questions were answered, and informed consent                            was obtained. Prior Anticoagulants: The patient has                            taken no previous anticoagulant or antiplatelet                            agents. ASA Grade Assessment: II - A patient with                            mild systemic disease. After reviewing the risks                            and benefits, the patient was deemed in                            satisfactory condition to undergo the procedure.  After obtaining informed consent, the endoscope was                            passed under direct vision. Throughout the                            procedure, the patient's blood pressure, pulse, and                            oxygen saturations were monitored continuously. The                            Endoscope was introduced through the mouth, and                            advanced to the second part of duodenum. The  upper                            GI endoscopy was accomplished without difficulty.                            The patient tolerated the procedure. Scope In: Scope Out: Findings:                 No gross lesions were noted in the entire esophagus.                           Multiple dispersed, small non-bleeding erosions                            were found in the gastric antrum. There were no                            stigmata of recent bleeding.                           No gross lesions were noted in the entire examined                            stomach. Biopsies were taken with a cold forceps                            for histology and Helicobacter pylori testing.                           Localized mildly erythematous mucosa without active                            bleeding and with no stigmata of bleeding was found                            in the duodenal bulb.                           No  gross lesions were noted in the first portion of                            the duodenum and in the second portion of the                            duodenum. Biopsies were taken with a cold forceps                            for histology. Complications:            No immediate complications. Estimated Blood Loss:     Estimated blood loss was minimal. Impression:               - No gross lesions in esophagus.                           - Non-bleeding erosive gastropathy.                           - No gross lesions in the stomach. Biopsied.                           - Erythematous duodenopathy.                           - No gross lesions in the first portion of the                            duodenum and in the second portion of the duodenum.                            Biopsied. Recommendation:           - Proceed to scheduled colonoscopy.                           - Await pathology results.                           - Observe patient's clinical course.                           - Start  Omeprazole 40 mg daily for next 2-3 months.                           - Minimize NSAIDs as able.                           - The findings and recommendations were discussed                            with the patient.                           - The findings and recommendations were discussed  with the patient's family. Justice Britain, MD 10/12/2018 3:44:07 PM

## 2018-10-12 NOTE — Progress Notes (Signed)
Spontaneous respirations throughout. VSS. Resting comfortably. To PACU on room air. Report to  RN. 

## 2018-10-12 NOTE — Op Note (Signed)
[place] [facility] Patient Name: Larry Martin Procedure Date: 10/12/2018 2:45 PM MRN: [id number] Endoscopist: Justice Britain , MD Age: 62 Referring MD:  Date of Birth: 1956/07/23 Gender: Male Account #: [id number] Procedure:                Colonoscopy Indications:              Screening for colorectal malignant neoplasm Medicines:                Monitored Anesthesia Care Procedure:                Pre-Anesthesia Assessment:                           - Prior to the procedure, a History and Physical                            was performed, and patient medications and                            allergies were reviewed. The patient's tolerance of                            previous anesthesia was also reviewed. The risks                            and benefits of the procedure and the sedation                            options and risks were discussed with the patient.                            All questions were answered, and informed consent                            was obtained. Prior Anticoagulants: The patient has                            taken no previous anticoagulant or antiplatelet                            agents. ASA Grade Assessment: II - A patient with                            mild systemic disease. After reviewing the risks                            and benefits, the patient was deemed in                            satisfactory condition to undergo the procedure.                           After obtaining informed consent, the colonoscope  was passed under direct vision. Throughout the                            procedure, the patient's blood pressure, pulse, and                            oxygen saturations were monitored continuously. The                            Colonoscope was introduced through the anus and                            advanced to the 5 cm into the ileum. The                            colonoscopy was  performed without difficulty. The                            patient tolerated the procedure. The quality of the                            bowel preparation was evaluated using the BBPS                            [facility] Bowel Preparation Scale) with scores of:                            Right Colon = 3, Transverse Colon = 3 and Left                            Colon = 3 (entire mucosa seen well with no residual                            staining, small fragments of stool or opaque                            liquid). The total BBPS score equals 9. Scope In: 3:22:34 PM Scope Out: 3:36:54 PM Scope Withdrawal Time: 0 hours 11 minutes 11 seconds  Total Procedure Duration: 0 hours 14 minutes 20 seconds  Findings:                 The digital rectal exam findings include                            non-thrombosed internal hemorrhoids. Pertinent                            negatives include no palpable rectal lesions.                           The terminal ileum and ileocecal valve appeared                            normal.  Four sessile polyps were found in the recto-sigmoid                            colon and sigmoid colon. The polyps were 1 to 2 mm                            in size. These had the appearance of hyperplastic                            polyps. These polyps were removed with a cold                            biopsy forceps. Resection and retrieval were                            complete.                           Normal mucosa was found in the entire colon                            otherwise.                           Non-bleeding non-thrombosed internal hemorrhoids                            were found during retroflexion, during perianal                            exam and during digital exam. The hemorrhoids were                            Grade II (internal hemorrhoids that prolapse but                            reduce  spontaneously). Complications:            No immediate complications. Estimated Blood Loss:     Estimated blood loss was minimal. Impression:               - Non-thrombosed internal hemorrhoids found on                            digital rectal exam.                           - The examined portion of the ileum was normal.                           - Four 1 to 2 mm polyps at the recto-sigmoid colon                            and in the sigmoid colon, removed with a cold  biopsy forceps. Resected and retrieved.                           - Normal mucosa in the entire examined colon.                           - Non-bleeding non-thrombosed internal hemorrhoids. Recommendation:           - The patient will be observed post-procedure,                            until all discharge criteria are met.                           - Discharge patient to home.                           - Patient has a contact number available for                            emergencies. The signs and symptoms of potential                            delayed complications were discussed with the                            patient. Return to normal activities tomorrow.                            Written discharge instructions were provided to the                            patient.                           - High fiber diet.                           - Continue present medications.                           - Await pathology results.                           - Repeat colonoscopy in 03/02/09 years for screening                            or surveillance based on pathology results and                            findings of adenomatous tissue.                           - The findings and recommendations were discussed                            with the patient.                           -  The findings and recommendations were discussed                            with the designated responsible  adult. Justice Britain, MD 10/12/2018 3:47:55 PM

## 2018-10-12 NOTE — Progress Notes (Signed)
Pt's states no medical or surgical changes since previsit or office visit. 

## 2018-10-13 ENCOUNTER — Telehealth: Payer: Self-pay | Admitting: *Deleted

## 2018-10-13 ENCOUNTER — Telehealth: Payer: Self-pay

## 2018-10-13 NOTE — Telephone Encounter (Signed)
  Follow up Call-  Call back number 10/12/2018  Post procedure Call Back phone  # [phone]  Permission to leave phone message Yes  Some recent data might be hidden     Left message

## 2018-10-13 NOTE — Telephone Encounter (Signed)
No answer. Left message to call if questions or concerns. 

## 2018-10-18 ENCOUNTER — Encounter: Payer: Self-pay | Admitting: Gastroenterology

## 2018-10-18 ENCOUNTER — Other Ambulatory Visit: Payer: Self-pay

## 2018-10-18 DIAGNOSIS — K219 Gastro-esophageal reflux disease without esophagitis: Secondary | ICD-10-CM

## 2018-10-18 MED ORDER — OMEPRAZOLE 40 MG PO CPDR: 40.0000 mg | DELAYED_RELEASE_CAPSULE | Freq: Two times a day (BID) | ORAL | 3 refills | Status: DC

## 2018-10-18 MED ORDER — CLARITHROMYCIN 500 MG PO TABS: 500.0000 mg | ORAL_TABLET | Freq: Two times a day (BID) | ORAL | 0 refills | Status: AC

## 2018-10-18 MED ORDER — AMOXICILLIN 500 MG PO TABS: 1000.0000 mg | ORAL_TABLET | Freq: Two times a day (BID) | ORAL | 0 refills | Status: DC

## 2018-10-18 MED FILL — CLARITHROMYCIN 500 MG TAB: 500 | 14 days supply | Qty: 28 | Fill #0

## 2018-10-18 MED FILL — AMOXICILLIN 500 MG CAPSULE: 500 | 14 days supply | Qty: 56 | Fill #0

## 2018-10-18 MED FILL — OMEPRAZOLE 40 MG CPDR: 40 | 30 days supply | Qty: 60 | Fill #0

## 2018-10-20 ENCOUNTER — Ambulatory Visit (INDEPENDENT_AMBULATORY_CARE_PROVIDER_SITE_OTHER): Payer: 59 | Admitting: Nurse Practitioner

## 2018-10-20 ENCOUNTER — Encounter: Payer: Self-pay | Admitting: Nurse Practitioner

## 2018-10-20 VITALS — BP 118/82 | HR 83 | Temp 98.1°F | Ht 67.25 in | Wt 175.8 lb

## 2018-10-20 DIAGNOSIS — M25511 Pain in right shoulder: Secondary | ICD-10-CM

## 2018-10-20 DIAGNOSIS — A048 Other specified bacterial intestinal infections: Secondary | ICD-10-CM

## 2018-10-20 NOTE — Patient Instructions (Signed)
   Take tylenol 500 mg twice a day for 5 days   Use heating pad twice a day on low twice a day make sure to not keep on for mor than 5-10 minutes  Return in 8 weeks for follow up if not better   Shoulder Pain Many things can cause shoulder pain, including:  An injury.  Moving the arm in the same way again and again (overuse).  Joint pain (arthritis).  Follow these instructions at home: Take these actions to help with your pain:  Squeeze a soft ball or a foam pad as much as you can. This helps to prevent swelling. It also makes the arm stronger.  Take over-the-counter and prescription medicines only as told by your doctor.  If told, put ice on the area: ? Put ice in a plastic bag. ? Place a towel between your skin and the bag. ? Leave the ice on for 20 minutes, 2-3 times per day. Stop putting on ice if it does not help with the pain.  If you were given a shoulder sling or immobilizer: ? Wear it as told. ? Remove it to shower or bathe. ? Move your arm as little as possible. ? Keep your hand moving. This helps prevent swelling.  Contact a doctor if:  Your pain gets worse.  Medicine does not help your pain.  You have new pain in your arm, hand, or fingers. Get help right away if:  Your arm, hand, or fingers: ? Tingle. ? Are numb. ? Are swollen. ? Are painful. ? Turn white or blue. This information is not intended to replace advice given to you by your health care provider. Make sure you discuss any questions you have with your health care provider. Document Released: 06/02/2008 Document Revised: 08/10/2016 Document Reviewed: 04/09/2015 Elsevier Interactive Patient Education  [employer].

## 2018-10-20 NOTE — Progress Notes (Signed)
  Subjective:     Patient ID: Larry Martin , male    DOB: 28-Jul-1956 , 62 y.o.   MRN: [id number]   Shoulder Pain   The pain is present in the neck and right shoulder. This is a new problem. The current episode started more than 1 month ago. There has been no history of extremity trauma (He works as a transporter at the hospital). The quality of the pain is described as aching. The pain is at a severity of 6/10. The pain is moderate. Associated symptoms include a limited range of motion and stiffness. Pertinent negatives include no inability to bear weight, joint swelling or tingling. The symptoms are aggravated by activity. He has tried acetaminophen for the symptoms. The treatment provided mild relief. Family history does not include gout. There is no history of osteoarthritis.     Past Medical History:  Diagnosis Date  . HLD (hyperlipidemia)       Current Outpatient Medications:  .  clarithromycin (BIAXIN) 500 MG tablet, Take 1 tablet (500 mg total) by mouth 2 (two) times daily for 14 days., Disp: 28 tablet, Rfl: 0 .  omeprazole (PRILOSEC) 40 MG capsule, Take 1 capsule (40 mg total) by mouth 2 (two) times daily., Disp: 60 capsule, Rfl: 3  Current Facility-Administered Medications:  .  0.9 %  sodium chloride infusion, 500 mL, Intravenous, Once, Mansouraty, Telford Nab., MD   No Known Allergies   Review of Systems  Constitutional: Negative.   Respiratory: Negative.   Cardiovascular: Negative.   Musculoskeletal: Positive for neck stiffness and stiffness. Negative for back pain and neck pain.       Right shoulder pain  Skin: Negative.   Neurological: Negative for tingling.     Today's Vitals   10/20/18 0854  BP: 118/82  Pulse: 83  Temp: 98.1 F (36.7 C)  TempSrc: Oral  SpO2: 97%  Weight: 175 lb 12.8 oz (79.7 kg)  Height: 5' 7.25" (1.708 m)  PainSc: 6   PainLoc: Shoulder   Body mass index is 27.33 kg/m.   Objective:  Physical Exam  Constitutional: He appears  well-developed and well-nourished.  Neck: Normal range of motion. Neck supple.  Cardiovascular: Normal rate, regular rhythm, normal heart sounds and intact distal pulses.  Pulmonary/Chest: Effort normal and breath sounds normal.  Musculoskeletal: He exhibits tenderness.       Right shoulder: He exhibits decreased range of motion and tenderness. He exhibits no bony tenderness, no swelling, no crepitus and no deformity.  Skin: Skin is warm and dry.        Assessment And Plan:     1. Acute pain of right shoulder  Bursitis vs osteoarthritis.  Unable to take NSAIDs due to abnormal colonoscopy, recommend he take Tylenol 500 mg twice a day for 5 days   Heating pad on low to shoulder 1-2 times per day  Return to office in 8 weeks after being seen by GI  Will obtain shoulder xray if not better in 8 weeks.  Also discussed with his wife via phone.  2. H. pylori infection  Diagnosed by GI and being treated with Clarithromycin, Amoxicillin and omeprazole.     [name], FNP

## 2018-11-12 ENCOUNTER — Ambulatory Visit (INDEPENDENT_AMBULATORY_CARE_PROVIDER_SITE_OTHER): Payer: 59 | Admitting: Gastroenterology

## 2018-11-12 ENCOUNTER — Encounter: Payer: Self-pay | Admitting: Gastroenterology

## 2018-11-12 ENCOUNTER — Other Ambulatory Visit (INDEPENDENT_AMBULATORY_CARE_PROVIDER_SITE_OTHER): Payer: 59

## 2018-11-12 VITALS — BP 118/72 | HR 96 | Ht 66.0 in | Wt 174.0 lb

## 2018-11-12 DIAGNOSIS — A048 Other specified bacterial intestinal infections: Secondary | ICD-10-CM

## 2018-11-12 DIAGNOSIS — K219 Gastro-esophageal reflux disease without esophagitis: Secondary | ICD-10-CM

## 2018-11-12 DIAGNOSIS — K5900 Constipation, unspecified: Secondary | ICD-10-CM | POA: Diagnosis not present

## 2018-11-12 DIAGNOSIS — R6881 Early satiety: Secondary | ICD-10-CM

## 2018-11-12 DIAGNOSIS — R140 Abdominal distension (gaseous): Secondary | ICD-10-CM | POA: Diagnosis not present

## 2018-11-12 DIAGNOSIS — Z1211 Encounter for screening for malignant neoplasm of colon: Secondary | ICD-10-CM | POA: Diagnosis not present

## 2018-11-12 DIAGNOSIS — R12 Heartburn: Secondary | ICD-10-CM

## 2018-11-12 LAB — CBC WITH DIFFERENTIAL/PLATELET
BASOS ABS: 0 10*3/uL (ref 0.0–0.1)
Basophils Relative: 1 % (ref 0.0–3.0)
Eosinophils Absolute: 0.1 10*3/uL (ref 0.0–0.7)
Eosinophils Relative: 3.7 % (ref 0.0–5.0)
HEMATOCRIT: 45.3 % (ref 39.0–52.0)
Hemoglobin: 14.4 g/dL (ref 13.0–17.0)
LYMPHS PCT: 47.2 % — AB (ref 12.0–46.0)
Lymphs Abs: 1.9 10*3/uL (ref 0.7–4.0)
MCHC: 31.7 g/dL (ref 30.0–36.0)
MCV: 75.4 fl — ABNORMAL LOW (ref 78.0–100.0)
Monocytes Absolute: 0.3 10*3/uL (ref 0.1–1.0)
Monocytes Relative: 6.9 % (ref 3.0–12.0)
NEUTROS ABS: 1.7 10*3/uL (ref 1.4–7.7)
Neutrophils Relative %: 41.2 % — ABNORMAL LOW (ref 43.0–77.0)
PLATELETS: 191 10*3/uL (ref 150.0–400.0)
RBC: 6.02 Mil/uL — AB (ref 4.22–5.81)
RDW: 13.3 % (ref 11.5–15.5)
WBC: 4.1 10*3/uL (ref 4.0–10.5)

## 2018-11-12 LAB — COMPREHENSIVE METABOLIC PANEL
ALT: 19 U/L (ref 0–53)
AST: 16 U/L (ref 0–37)
Albumin: 4.6 g/dL (ref 3.5–5.2)
Alkaline Phosphatase: 28 U/L — ABNORMAL LOW (ref 39–117)
BILIRUBIN TOTAL: 0.4 mg/dL (ref 0.2–1.2)
BUN: 16 mg/dL (ref 6–23)
CALCIUM: 9.5 mg/dL (ref 8.4–10.5)
CO2: 26 meq/L (ref 19–32)
Chloride: 105 mEq/L (ref 96–112)
Creatinine, Ser: 1.28 mg/dL (ref 0.40–1.50)
GFR: 73.71 mL/min (ref >60.00)
GLUCOSE: 97 mg/dL (ref 70–99)
Potassium: 3.8 mEq/L (ref 3.5–5.1)
Sodium: 139 mEq/L (ref 135–145)
Total Protein: 7.1 g/dL (ref 6.0–8.3)

## 2018-11-12 LAB — IRON: Iron: 37 ug/dL — ABNORMAL LOW (ref 42–165)

## 2018-11-12 MED ORDER — OMEPRAZOLE 40 MG PO CPDR: 40.0000 mg | DELAYED_RELEASE_CAPSULE | Freq: Two times a day (BID) | ORAL | 2 refills | Status: DC

## 2018-11-12 MED FILL — OMEPRAZOLE 40 MG CPDR: 40 | 30 days supply | Qty: 60 | Fill #0

## 2018-11-12 NOTE — Progress Notes (Signed)
[facility] VISIT   Primary Care Provider [place]ale Chard, [address] [place] [address] [phone]  Patient Profile: Larry Martin is a 62 y.o. male with a pmh significant for HLD, Constipation, infrequent GERD, gastritis with H. pylori infection.  The patient presents to the [facility] for an evaluation and management of problem(s) noted below:  Problem List 1. H. pylori infection   2. Gastroesophageal reflux disease, esophagitis presence not specified     History of Present Illness: Please see initial consultation note for full details of history of present illness.    Interval History The patient returns for planned follow-up.  Patient underwent an upper and lower endoscopic evaluation because of his history of symptoms and need for colon cancer screening.  His upper endoscopy was significant for finding of nonbleeding erosions with biopsies that were positive for H. pylori.  He was not able to afford Pylera however when the medications were sent individually he was able to afford this.  He is currently taking his medications for his H. pylori infection.  He remains on acid reducing medications.  After just a few days of being on the acid medicine and then now being on antibiotics he has had complete resolution of all of his symptoms.  He feels great.  There is no abdominal pain.  No nausea no vomiting.  His bowel habits are also improved currently as well.    GI Review of Systems Positive as above Negative for dysphagia, odynophagia, hematochezia, melena  Review of Systems General: Denies fevers/chills/weight loss Cardiovascular: Denies chest pain/palpitations Pulmonary: Denies shortness of breath Gastroenterological: See HPI Hematological: Denies easy bruising Dermatological: Denies jaundice Psychological: Mood is stable   Medications Current Outpatient Medications  Medication Sig Dispense Refill  .  omeprazole (PRILOSEC) 40 MG capsule Take 1 capsule (40 mg total) by mouth 2 (two) times daily. 60 capsule 2   No current facility-administered medications for this visit.    Allergies No Known Allergies  Histories Past Medical History:  Diagnosis Date  . H. pylori infection   . HLD (hyperlipidemia)    Past Surgical History:  Procedure Laterality Date  . CYST REMOVAL TRUNK  1997   chest   Social History   Socioeconomic History  . Marital status: Married    Spouse name: Not on file  . Number of children: Not on file  . Years of education: Not on file  . Highest education level: Not on file  Occupational History  . Not on file  Social Needs  . Financial resource strain: Not on file  . Food insecurity:    Worry: Not on file    Inability: Not on file  . Transportation needs:    Medical: Not on file    Non-medical: Not on file  Tobacco Use  . Smoking status: Current Every Day Smoker    Packs/day: 0.30    Years: 35.00    Pack years: 10.50    Types: Cigarettes  . Smokeless tobacco: Never Used  Substance and Sexual Activity  . Alcohol use: No    Alcohol/week: 0.0 standard drinks  . Drug use: Never  . Sexual activity: Never    Partners: Female  Lifestyle  . Physical activity:    Days per week: Not on file    Minutes per session: Not on file  . Stress: Not on file  Relationships  . Social connections:    Talks on phone: Not on file  Gets together: Not on file    Attends religious service: Not on file    Active member of club or organization: Not on file    Attends meetings of clubs or organizations: Not on file    Relationship status: Not on file  . Intimate partner violence:    Fear of current or ex partner: Not on file    Emotionally abused: Not on file    Physically abused: Not on file    Forced sexual activity: Not on file  Other Topics Concern  . Not on file  Social History Narrative  . Not on file   Family History  Problem Relation Age of Onset    . Blindness Mother        not leagally blind  . Colon cancer Neg Hx   . Stomach cancer Neg Hx   . Esophageal cancer Neg Hx   . Inflammatory bowel disease Neg Hx   . Liver disease Neg Hx   . Pancreatic cancer Neg Hx   . Rectal cancer Neg Hx    I have reviewed his medical, social, and family history in detail and updated the electronic medical record as necessary.    PHYSICAL EXAMINATION  BP 118/72   Pulse 96   Ht 5\' 6"  (1.676 m)   Wt 174 lb (78.9 kg)   BMI 28.08 kg/m  GEN: NAD, appears stated age, doesn't appear chronically ill PSYCH: Cooperative, without pressured speech EYE: Conjunctivae pink, sclerae anicteric ENT: MMM CV: RR without R/Gs  RESP: CTAB posteriorly GI: NABS, soft, NT/ND, without rebound or guarding, no HSM appreciated MSK/EXT: No LE edema SKIN: No jaundice  NEURO:  Alert & Oriented x 3, no focal deficits   REVIEW OF DATA  I reviewed the following data at the time of this encounter:  GI Procedures and Studies  October 2019 EGD - No gross lesions in esophagus. - Non-bleeding erosive gastropathy. - No gross lesions in the stomach. Biopsied. - Erythematous duodenopathy. - No gross lesions in the first portion of the duodenum and in the second portion of the duodenum. Biopsied. Positive for H pylori on stomach biopsies and normal duodenal biopsies.  October 2019 colonoscopy - Non-thrombosed internal hemorrhoids found on digital rectal exam. - The examined portion of the ileum was normal. - Four 1 to 2 mm polyps at the recto-sigmoid colon and in the sigmoid colon, removed with a cold biopsy forceps. Resected and retrieved. - Normal mucosa in the entire examined colon. - Non-bleeding non-thrombosed internal hemorrhoids. Polyps were all hyperplastic plan for 10-year follow-up.  Laboratory Studies  Reviewed in epic  Imaging Studies  No relevant studies   ASSESSMENT  Mr. Raineri is a 62 y.o. male with a pmh significant for HLD, Constipation,  infrequent GERD, gastritis with H. pylori infection.  The patient is seen today for a return visit for evaluation and management of:  1. H. pylori infection   2. Gastroesophageal reflux disease, esophagitis presence not specified    Patient is doing exceedingly well at this point in time while on acid reducing medicines and while being on treatment for his H. pylori.  The patient's constipation is also improved currently as well.  At this junction we will plan to complete his H. pylori treatment at which point in 2 months we will plan for him to come off of his acid reducing medications and subsequently have H. pylori tested via stool antigen to ensure that he has had complete eradication.  If he does well  he will not need any further follow-up as needed basis.  He will monitor his stool output previous constipation.  All patient questions were answered, to the best of my ability, and the patient agrees to the aforementioned plan of action with follow-up as indicated.   PLAN  1. Gastroesophageal reflux disease, esophagitis presence not specified  2. H. pylori infection - Complete HP antibiotics for 2-week course - Continue PPI 40 mg BID for 4-month then 40 mg QD for 24-month then in February stop for 2-3 weeks all PPI - Will plan when off PPI for Helicobacter pylori special antigen; Future  3. Other - Hepatitis c antibody (reflex); Future   Orders Placed This Encounter  Procedures  . Helicobacter pylori special antigen  . Hepatitis c antibody (reflex)    New Prescriptions   No medications on file   Modified Medications   Modified Medication Previous Medication   OMEPRAZOLE (PRILOSEC) 40 MG CAPSULE omeprazole (PRILOSEC) 40 MG capsule      Take 1 capsule (40 mg total) by mouth 2 (two) times daily.    Take 1 capsule (40 mg total) by mouth 2 (two) times daily.    Planned Follow Up: No follow-ups on file.   Justice Britain, MD [facility]  # [phone]

## 2018-11-12 NOTE — Addendum Note (Signed)
Addended by: Raliegh Ip on: 11/12/2018 04:37 PM   Modules accepted: Orders

## 2018-11-12 NOTE — Patient Instructions (Addendum)
Take omeprazole 40mg  twice daily starting 11/15/18-12/15/18                                               Then take Omeprazole 40mg  daily starting 12/16/18-01/15/19  In February give stool sample for H pylori  Your provider has requested that you go to the basement level for lab work before leaving today. Press "B" on the elevator. The lab is located at the first door on the left as you exit the elevator.  Thank you for entrusting me with your care and choosing [facility].  Dr Rush Landmark

## 2018-11-13 LAB — HCV COMMENT:

## 2018-11-13 LAB — IRON AND TIBC
IRON: 72 ug/dL (ref 38–169)
Iron Saturation: 30 % (ref 15–55)
Total Iron Binding Capacity: 243 ug/dL — ABNORMAL LOW (ref 250–450)
UIBC: 171 ug/dL (ref 111–343)

## 2018-11-13 LAB — HEPATITIS C ANTIBODY (REFLEX)

## 2018-11-15 ENCOUNTER — Other Ambulatory Visit: Payer: Self-pay

## 2018-11-15 DIAGNOSIS — R718 Other abnormality of red blood cells: Secondary | ICD-10-CM

## 2018-11-15 LAB — TSH: TSH: 1.8 u[IU]/mL (ref 0.35–4.50)

## 2018-11-16 ENCOUNTER — Encounter: Payer: Self-pay | Admitting: Gastroenterology

## 2018-11-16 DIAGNOSIS — K219 Gastro-esophageal reflux disease without esophagitis: Secondary | ICD-10-CM | POA: Insufficient documentation

## 2018-11-16 DIAGNOSIS — A048 Other specified bacterial intestinal infections: Secondary | ICD-10-CM | POA: Insufficient documentation

## 2018-12-15 ENCOUNTER — Ambulatory Visit: Payer: 59 | Admitting: Nurse Practitioner

## 2019-05-09 ENCOUNTER — Encounter: Payer: 59 | Admitting: Nurse Practitioner

## 2019-10-10 DIAGNOSIS — U071 COVID-19: Secondary | ICD-10-CM | POA: Diagnosis not present

## 2020-11-13 ENCOUNTER — Ambulatory Visit: Payer: 59 | Admitting: Nurse Practitioner

## 2020-11-13 ENCOUNTER — Encounter: Payer: Self-pay | Admitting: Nurse Practitioner

## 2020-11-13 ENCOUNTER — Other Ambulatory Visit: Payer: Self-pay

## 2020-11-13 ENCOUNTER — Ambulatory Visit
Admission: RE | Admit: 2020-11-13 | Discharge: 2020-11-13 | Disposition: A | Discharge status: Home / Self Care | Payer: 59 | Source: Ambulatory Visit | Attending: Nurse Practitioner | Admitting: Nurse Practitioner

## 2020-11-13 VITALS — BP 122/80 | HR 92 | Temp 98.7°F | Ht 68.6 in | Wt 174.4 lb

## 2020-11-13 DIAGNOSIS — R1915 Other abnormal bowel sounds: Secondary | ICD-10-CM

## 2020-11-13 DIAGNOSIS — R109 Unspecified abdominal pain: Secondary | ICD-10-CM | POA: Diagnosis not present

## 2020-11-13 DIAGNOSIS — R1084 Generalized abdominal pain: Secondary | ICD-10-CM | POA: Diagnosis not present

## 2020-11-13 MED ORDER — POLYETHYLENE GLYCOL 3350 17 G PO PACK: 17.0000 g | PACK | Freq: Every day | ORAL | 0 refills | Status: DC

## 2020-11-13 MED ORDER — DOCUSATE SODIUM 100 MG PO CAPS: 100.0000 mg | ORAL_CAPSULE | Freq: Every day | ORAL | 1 refills | Status: AC | PRN

## 2020-11-13 NOTE — Patient Instructions (Signed)
   Drink at least 4 - 16 oz bottles of water a day  Take a stool softner such as docusate sodium daily   I am giving you samples of linzess to take for the next few days to see if effective.

## 2020-11-13 NOTE — Progress Notes (Signed)
I,Yamilka Roman [employer] as a Education administrator for [employer], FNP.,have documented all relevant documentation on the behalf of [name], FNP,as directed by  [name], FNP while in the presence of [name], [place]. This visit occurred during the SARS-CoV-2 public health emergency.  Safety protocols were in place, including screening questions prior to the visit, additional usage of staff PPE, and extensive cleaning of exam room while observing appropriate contact time as indicated for disinfecting solutions.  Subjective:     Patient ID: Larry Martin , male    DOB: 1956/06/10 , 64 y.o.   MRN: [id number]   Chief Complaint  Patient presents with  . Elevated blood pressure    patient stated he had a headache thursday and when the nurse checked his blood pressure it was a little elevated  . Back Pain    HPI  He is here today after having a headache and his blood pressure was elevated. For the last 2 weeks he has not been feeling well at all.  He is feeling full too long, reports feeling an increase in gas to his stomach.  Last week his blood pressure was 127/93.  It is not easy for him to go to the bathroom.  He will drink a couple bottles of water, will drink juice more than water.   He had his booster Friday and had symptoms related to covid after for couple days.      Past Medical History:  Diagnosis Date  . H. pylori infection   . HLD (hyperlipidemia)      Family History  Problem Relation Age of Onset  . Blindness Mother        not leagally blind  . Colon cancer Neg Hx   . Stomach cancer Neg Hx   . Esophageal cancer Neg Hx   . Inflammatory bowel disease Neg Hx   . Liver disease Neg Hx   . Pancreatic cancer Neg Hx   . Rectal cancer Neg Hx      Current Outpatient Medications:  .  docusate sodium (COLACE) 100 MG capsule, Take 1 capsule (100 mg total) by mouth daily as needed., Disp: 30 capsule, Rfl: 1 .  omeprazole (PRILOSEC) 40 MG capsule, Take 1 capsule (40 mg  total) by mouth 2 (two) times daily. (Patient not taking: Reported on 11/13/2020), Disp: 60 capsule, Rfl: 2 .  polyethylene glycol (MIRALAX) 17 g packet, Take 17 g by mouth daily., Disp: 14 each, Rfl: 0   No Known Allergies   Review of Systems  Constitutional: Negative.   Respiratory: Negative.   Cardiovascular: Negative.  Negative for chest pain, palpitations and leg swelling.  Musculoskeletal: Positive for back pain (after sitting for long time - will take tylenol).  Neurological: Negative for dizziness and headaches.  Psychiatric/Behavioral: Negative.      Today's Vitals   11/13/20 1552  BP: 122/80  Pulse: 92  Temp: 98.7 F (37.1 C)  TempSrc: Oral  Weight: 174 lb 6.4 oz (79.1 kg)  Height: 5' 8.6" (1.742 m)  PainSc: 5   PainLoc: Back   Body mass index is 26.06 kg/m.   Objective:  Physical Exam Vitals reviewed.  Constitutional:      General: He is not in acute distress.    Appearance: Normal appearance.  Cardiovascular:     Rate and Rhythm: Normal rate and regular rhythm.     Pulses: Normal pulses.     Heart sounds: No murmur heard.   Pulmonary:     Effort: Pulmonary  effort is normal. No respiratory distress.     Breath sounds: Normal breath sounds.  Abdominal:     General: Abdomen is flat. There is no distension.     Palpations: Abdomen is soft.     Tenderness: There is no abdominal tenderness. There is no right CVA tenderness or left CVA tenderness.     Comments: Decreased bowel sounds  Neurological:     General: No focal deficit present.     Mental Status: He is alert and oriented to person, place, and time.     Cranial Nerves: No cranial nerve deficit.  Psychiatric:        Mood and Affect: Mood normal.        Behavior: Behavior normal.        Thought Content: Thought content normal.        Judgment: Judgment normal.         Assessment And Plan:     1. Generalized abdominal pain  No tenderness on palpation however will check KUB  I have advised  him to take miralax and to increase his water intake   He is to take a stool softner - DG Abd 1 View; Future - CMP14+EGFR - CBC  2. Decreased bowel sounds  I have sent him to get a KUB, pending results will consider referring back to GI - DG Abd 1 View; Future - CMP14+EGFR - CBC     Patient was given opportunity to ask questions. Patient verbalized understanding of the plan and was able to repeat key elements of the plan. All questions were answered to their satisfaction.    Teola Bradley, FNP, have reviewed all documentation for this visit. The documentation on 11/14/20 for the exam, diagnosis, procedures, and orders are all accurate and complete.  THE PATIENT IS ENCOURAGED TO PRACTICE SOCIAL DISTANCING DUE TO THE COVID-19 PANDEMIC.

## 2020-11-14 LAB — CMP14+EGFR
ALT: 25 IU/L (ref 0–44)
AST: 21 IU/L (ref 0–40)
Albumin/Globulin Ratio: 2 (ref 1.2–2.2)
Albumin: 4.5 g/dL (ref 3.8–4.8)
Alkaline Phosphatase: 34 IU/L — ABNORMAL LOW (ref 44–121)
BUN/Creatinine Ratio: 10 (ref 10–24)
BUN: 12 mg/dL (ref 8–27)
Bilirubin Total: 0.3 mg/dL (ref 0.0–1.2)
CO2: 25 mmol/L (ref 20–29)
Calcium: 9.8 mg/dL (ref 8.6–10.2)
Chloride: 101 mmol/L (ref 96–106)
Creatinine, Ser: 1.17 mg/dL (ref 0.76–1.27)
GFR calc Af Amer: 77 mL/min/{1.73_m2} (ref >59)
GFR calc non Af Amer: 66 mL/min/{1.73_m2} (ref >59)
Globulin, Total: 2.3 g/dL (ref 1.5–4.5)
Glucose: 95 mg/dL (ref 65–99)
Potassium: 4.3 mmol/L (ref 3.5–5.2)
Sodium: 142 mmol/L (ref 134–144)
Total Protein: 6.8 g/dL (ref 6.0–8.5)

## 2020-11-14 LAB — CBC
Hematocrit: 46 % (ref 37.5–51.0)
Hemoglobin: 14.3 g/dL (ref 13.0–17.7)
MCH: 24.1 pg — ABNORMAL LOW (ref 26.6–33.0)
MCHC: 31.1 g/dL — ABNORMAL LOW (ref 31.5–35.7)
MCV: 77 fL — ABNORMAL LOW (ref 79–97)
Platelets: 191 10*3/uL (ref 150–450)
RBC: 5.94 x10E6/uL — ABNORMAL HIGH (ref 4.14–5.80)
RDW: 13.1 % (ref 11.6–15.4)
WBC: 4.3 10*3/uL (ref 3.4–10.8)

## 2020-12-04 ENCOUNTER — Other Ambulatory Visit: Payer: Self-pay

## 2020-12-04 ENCOUNTER — Ambulatory Visit: Payer: 59 | Admitting: Nurse Practitioner

## 2020-12-04 ENCOUNTER — Encounter: Payer: Self-pay | Admitting: Nurse Practitioner

## 2020-12-04 VITALS — BP 136/88 | HR 95 | Temp 98.6°F | Ht 68.6 in | Wt 176.0 lb

## 2020-12-04 DIAGNOSIS — Z720 Tobacco use: Secondary | ICD-10-CM

## 2020-12-04 DIAGNOSIS — R1084 Generalized abdominal pain: Secondary | ICD-10-CM

## 2020-12-04 DIAGNOSIS — R030 Elevated blood-pressure reading, without diagnosis of hypertension: Secondary | ICD-10-CM | POA: Diagnosis not present

## 2020-12-04 MED ORDER — MAGNESIUM 250 MG PO TABS: 1.0000 | ORAL_TABLET | Freq: Every evening | ORAL | 0 refills | Status: DC

## 2020-12-04 NOTE — Patient Instructions (Signed)
Take over the counter magnesium 250 mg daily with evening meal. You can get this over the counter.

## 2020-12-04 NOTE — Progress Notes (Addendum)
Rutherford Nail as a scribe for [name], FNP.,have documented all relevant documentation on the behalf of [name], FNP,as directed by  [name], FNP while in the presence of [name], [place]. This visit occurred during the SARS-CoV-2 public health emergency.  Safety protocols were in place, including screening questions prior to the visit, additional usage of staff PPE, and extensive cleaning of exam room while observing appropriate contact time as indicated for disinfecting solutions.  Subjective:     Patient ID: Larry Martin , male    DOB: 1956/11/23 , 64 y.o.   MRN: [id number]   Chief Complaint  Patient presents with  . Abdominal Pain    f/u    HPI  He reports he is feeling better with his abdomen pain.  He has a history of H Pylori but unsure of when. He ate white rice with chicken liver today and drank iced tea. He has had 2 bottles of water today  12/21/2019, 01/11/2020 was when he had his covid vaccine    Past Medical History:  Diagnosis Date  . H. pylori infection   . HLD (hyperlipidemia)      Family History  Problem Relation Age of Onset  . Blindness Mother        not leagally blind  . Colon cancer Neg Hx   . Stomach cancer Neg Hx   . Esophageal cancer Neg Hx   . Inflammatory bowel disease Neg Hx   . Liver disease Neg Hx   . Pancreatic cancer Neg Hx   . Rectal cancer Neg Hx      Current Outpatient Medications:  .  docusate sodium (COLACE) 100 MG capsule, Take 1 capsule (100 mg total) by mouth daily as needed., Disp: 30 capsule, Rfl: 1 .  omeprazole (PRILOSEC) 40 MG capsule, Take 1 capsule (40 mg total) by mouth 2 (two) times daily., Disp: 60 capsule, Rfl: 2 .  polyethylene glycol (MIRALAX) 17 g packet, Take 17 g by mouth daily., Disp: 14 each, Rfl: 0 .  Magnesium 250 MG TABS, Take 1 tablet (250 mg total) by mouth every evening. With evening meal, Disp: 90 tablet, Rfl: 0   No Known Allergies   Review of Systems  Constitutional:  Negative.  Negative for fatigue.  HENT: Negative.   Respiratory: Negative.   Cardiovascular: Negative.  Negative for chest pain, palpitations and leg swelling.  Endocrine: Negative for polydipsia, polyphagia and polyuria.  Musculoskeletal: Negative.   Skin: Negative.   Neurological: Negative for dizziness and headaches.  Psychiatric/Behavioral: Negative.      Today's Vitals   12/04/20 1547 12/04/20 1614  BP: (!) 138/92 136/88  Pulse: 95   Temp: 98.6 F (37 C)   TempSrc: Oral   Weight: 176 lb (79.8 kg)   Height: 5' 8.6" (1.742 m)   PainSc: 0-No pain    Body mass index is 26.29 kg/m.  Wt Readings from Last 3 Encounters:  12/04/20 176 lb (79.8 kg)  11/13/20 174 lb 6.4 oz (79.1 kg)  11/12/18 174 lb (78.9 kg)   Objective:  Physical Exam Vitals reviewed.  Constitutional:      General: He is not in acute distress.    Appearance: He is well-developed.  Abdominal:     General: Bowel sounds are normal.     Tenderness: There is no abdominal tenderness. There is no right CVA tenderness.  Skin:    Capillary Refill: Capillary refill takes less than 2 seconds.  Neurological:     General: No  focal deficit present.     Mental Status: He is alert and oriented to person, place, and time.     Cranial Nerves: No cranial nerve deficit.  Psychiatric:        Mood and Affect: Mood normal.        Behavior: Behavior normal.         Assessment And Plan:     1. Generalized abdominal pain  This has resolved and he was treated for h pylori  Abdomen xray was normal  2. Tobacco abuse  Will check baseline CXR due to his history of tobacco use - DG Chest 2 View; Future  3. Elevated blood-pressure reading, without diagnosis of hypertension  Slightly elevated, no current medications  He is encouraged to limit his intake of salt   If remains elevated will need to consider medications      Patient was given opportunity to ask questions. Patient verbalized understanding of the plan  and was able to repeat key elements of the plan. All questions were answered to their satisfaction.    Teola Bradley, FNP, have reviewed all documentation for this visit. The documentation on 12/23/20 for the exam, diagnosis, procedures, and orders are all accurate and complete.   THE PATIENT IS ENCOURAGED TO PRACTICE SOCIAL DISTANCING DUE TO THE COVID-19 PANDEMIC.

## 2021-04-15 ENCOUNTER — Encounter: Payer: 59 | Admitting: Internal Medicine

## 2021-06-20 ENCOUNTER — Ambulatory Visit (INDEPENDENT_AMBULATORY_CARE_PROVIDER_SITE_OTHER): Payer: 59 | Admitting: Nurse Practitioner

## 2021-06-20 ENCOUNTER — Other Ambulatory Visit: Payer: Self-pay

## 2021-06-20 VITALS — BP 138/88 | HR 84 | Temp 98.3°F | Ht 68.4 in | Wt 181.6 lb

## 2021-06-20 DIAGNOSIS — Z6827 Body mass index (BMI) 27.0-27.9, adult: Secondary | ICD-10-CM

## 2021-06-20 DIAGNOSIS — R030 Elevated blood-pressure reading, without diagnosis of hypertension: Secondary | ICD-10-CM | POA: Diagnosis not present

## 2021-06-20 DIAGNOSIS — K5909 Other constipation: Secondary | ICD-10-CM

## 2021-06-20 DIAGNOSIS — E7801 Familial hypercholesterolemia: Secondary | ICD-10-CM

## 2021-06-20 DIAGNOSIS — E663 Overweight: Secondary | ICD-10-CM | POA: Diagnosis not present

## 2021-06-20 LAB — CMP14+EGFR
ALT: 27 IU/L (ref 0–44)
AST: 19 IU/L (ref 0–40)
Albumin/Globulin Ratio: 2.1 (ref 1.2–2.2)
Albumin: 4.6 g/dL (ref 3.8–4.8)
Alkaline Phosphatase: 34 IU/L — ABNORMAL LOW (ref 44–121)
BUN/Creatinine Ratio: 6 — ABNORMAL LOW (ref 10–24)
BUN: 8 mg/dL (ref 8–27)
Bilirubin Total: 0.4 mg/dL (ref 0.0–1.2)
CO2: 21 mmol/L (ref 20–29)
Calcium: 9.7 mg/dL (ref 8.6–10.2)
Chloride: 100 mmol/L (ref 96–106)
Creatinine, Ser: 1.28 mg/dL — ABNORMAL HIGH (ref 0.76–1.27)
Globulin, Total: 2.2 g/dL (ref 1.5–4.5)
Glucose: 89 mg/dL (ref 65–99)
Potassium: 4.3 mmol/L (ref 3.5–5.2)
Sodium: 141 mmol/L (ref 134–144)
Total Protein: 6.8 g/dL (ref 6.0–8.5)
eGFR: 63 mL/min/{1.73_m2} (ref >59)

## 2021-06-20 LAB — CBC
Hematocrit: 49.2 % (ref 37.5–51.0)
Hemoglobin: 15.2 g/dL (ref 13.0–17.7)
MCH: 24 pg — ABNORMAL LOW (ref 26.6–33.0)
MCHC: 30.9 g/dL — ABNORMAL LOW (ref 31.5–35.7)
MCV: 78 fL — ABNORMAL LOW (ref 79–97)
Platelets: 197 10*3/uL (ref 150–450)
RBC: 6.34 x10E6/uL — ABNORMAL HIGH (ref 4.14–5.80)
RDW: 14 % (ref 11.6–15.4)
WBC: 4.1 10*3/uL (ref 3.4–10.8)

## 2021-06-20 LAB — LIPID PANEL
Chol/HDL Ratio: 5.1 ratio — ABNORMAL HIGH (ref 0.0–5.0)
Cholesterol, Total: 221 mg/dL — ABNORMAL HIGH (ref 100–199)
HDL: 43 mg/dL (ref >39)
LDL Chol Calc (NIH): 137 mg/dL — ABNORMAL HIGH (ref 0–99)
Triglycerides: 229 mg/dL — ABNORMAL HIGH (ref 0–149)
VLDL Cholesterol Cal: 41 mg/dL — ABNORMAL HIGH (ref 5–40)

## 2021-06-20 MED ORDER — AMLODIPINE BESYLATE 5 MG PO TABS: 5.0000 mg | ORAL_TABLET | Freq: Every day | ORAL | 2 refills | Status: DC

## 2021-06-20 NOTE — Progress Notes (Signed)
I,Tianna Badgett,acting as a Education administrator for [employer], NP.,have documented all relevant documentation on the behalf of [employer], NP,as directed by  Bary Castilla, NP while in the presence of Bary Castilla, NP.  This visit occurred during the SARS-CoV-2 public health emergency.  Safety protocols were in place, including screening questions prior to the visit, additional usage of staff PPE, and extensive cleaning of exam room while observing appropriate contact time as indicated for disinfecting solutions.  Subjective:     Patient ID: Larry Martin , male    DOB: 1956-01-19 , 65 y.o.   MRN: [id number]   Chief Complaint  Patient presents with   Hypertension    HPI  Patient is here for evaluation of elevated bp. Last night he checked his blood pressure and it was elevated at 156/90 so his wife made him an appointment. He has complaints of headache and constipation at times. At home his Bp runs 141/98.  His wife is here with him and states that he has recently changed jobs and has some added stressors that may have increased his BP. He doesn't have the best diet.    Hypertension Associated symptoms include headaches. Pertinent negatives include no chest pain, palpitations or shortness of breath.    Past Medical History:  Diagnosis Date   H. pylori infection    HLD (hyperlipidemia)      Family History  Problem Relation Age of Onset   Blindness Mother        not leagally blind   Colon cancer Neg Hx    Stomach cancer Neg Hx    Esophageal cancer Neg Hx    Inflammatory bowel disease Neg Hx    Liver disease Neg Hx    Pancreatic cancer Neg Hx    Rectal cancer Neg Hx      Current Outpatient Medications:    amLODipine (NORVASC) 5 MG tablet, Take 1 tablet (5 mg total) by mouth daily., Disp: 30 tablet, Rfl: 2   docusate sodium (COLACE) 100 MG capsule, Take 1 capsule (100 mg total) by mouth daily as needed., Disp: 30 capsule, Rfl: 1   Magnesium 250 MG TABS, Take 1  tablet (250 mg total) by mouth every evening. With evening meal, Disp: 90 tablet, Rfl: 0   omeprazole (PRILOSEC) 40 MG capsule, Take 1 capsule (40 mg total) by mouth 2 (two) times daily., Disp: 60 capsule, Rfl: 2   polyethylene glycol (MIRALAX) 17 g packet, Take 17 g by mouth daily., Disp: 14 each, Rfl: 0   No Known Allergies   Review of Systems  Constitutional:  Negative for chills, fatigue and fever.  HENT:  Negative for congestion, sinus pressure and sinus pain.   Respiratory:  Negative for cough, shortness of breath and wheezing.   Cardiovascular:  Negative for chest pain and palpitations.  Gastrointestinal:  Positive for constipation. Negative for diarrhea and nausea.  Musculoskeletal:  Negative for arthralgias and myalgias.  Neurological:  Positive for headaches. Negative for numbness.    Today's Vitals   06/20/21 1005  BP: 138/88  Pulse: 84  Temp: 98.3 F (36.8 C)  TempSrc: Oral  Weight: 181 lb 9.6 oz (82.4 kg)  Height: 5' 8.4" (1.737 m)   Body mass index is 27.29 kg/m.  Wt Readings from Last 3 Encounters:  06/20/21 181 lb 9.6 oz (82.4 kg)  12/04/20 176 lb (79.8 kg)  11/13/20 174 lb 6.4 oz (79.1 kg)    Objective:  Physical Exam Constitutional:      Appearance: Normal  appearance.  HENT:     Head: Normocephalic and atraumatic.  Cardiovascular:     Rate and Rhythm: Normal rate and regular rhythm.     Pulses: Normal pulses.     Heart sounds: Normal heart sounds. No murmur heard. Pulmonary:     Effort: Pulmonary effort is normal. No respiratory distress.     Breath sounds: Normal breath sounds.  Skin:    General: Skin is warm and dry.     Capillary Refill: Capillary refill takes less than 2 seconds.  Neurological:     Mental Status: He is alert and oriented to person, place, and time.        Assessment And Plan:     1. Elevated blood-pressure reading, without diagnosis of hypertension -Limit the intake of processed foods and salt intake. You should increase  your intake of green vegetables and fruits. Limit the use of alcohol. Limit fast foods and fried foods. Avoid high fatty saturated and trans fat foods. Keep yourself hydrated with drinking water. Avoid red meats. Eat lean meats instead. Exercise for atleast 30-45 min for atleast 4-5 times a week.  -will start him on BP med. Today -Advised patient to check his BP at home and send [place] with a log on mychart  -Recommended follow up appt in month for BP check  - CMP14+EGFR - CBC no Diff - amLODipine (NORVASC) 5 MG tablet; Take 1 tablet (5 mg total) by mouth daily.  Dispense: 30 tablet; Refill: 2  2. Familial hypercholesterolemia --Educated patient about a diet that is low in fat and high fatty foods including dairy products. Increase in take of fish and fiber. Decrease intake of red meats and fast foods. Exercise for atleast 4-5 times a week or atleast 30-45 min. Drink a lot of water.   -Will check his lipid panel today  - Lipid panel  3. Other constipation  -Advised patient to drink a lot of water, increase fiber intake.  -Take stool softener as needed  -Increase his physical activity and exercise    4. Overweight with body mass index (BMI) of 27 to 27.9 in adult Advised patient on a healthy diet including avoiding fast food and red meats. Increase the intake of lean meats including grilled chicken and [place].  Drink a lot of water. Decrease intake of fatty foods. Exercise for 30-45 min. 4-5 a week to decrease the risk of cardiac event.   The patient was encouraged to call or send a message through [place] for any questions or concerns.   Follow up: BP check in one month and physical exam in 3 months.   Patient was given opportunity to ask questions. Patient verbalized understanding of the plan and was able to repeat key elements of the plan. All questions were answered to their satisfaction.  Raman Oley Lahaie, DNP   I, Raman Jayleen Afonso have reviewed all documentation for this visit. The  documentation on 06/20/21 for the exam, diagnosis, procedures, and orders are all accurate and complete.    IF YOU HAVE BEEN REFERRED TO A SPECIALIST, IT MAY TAKE 1-2 WEEKS TO SCHEDULE/PROCESS THE REFERRAL. IF YOU HAVE NOT HEARD FROM US/SPECIALIST IN TWO WEEKS, PLEASE GIVE [place] A CALL AT [phone] X [phone].   THE PATIENT IS ENCOURAGED TO PRACTICE SOCIAL DISTANCING DUE TO THE COVID-19 PANDEMIC.

## 2021-06-25 ENCOUNTER — Other Ambulatory Visit: Payer: Self-pay | Admitting: Nurse Practitioner

## 2021-06-25 DIAGNOSIS — E7801 Familial hypercholesterolemia: Secondary | ICD-10-CM

## 2021-06-25 MED ORDER — ATORVASTATIN CALCIUM 10 MG PO TABS: 10.0000 mg | ORAL_TABLET | Freq: Every day | ORAL | 2 refills | Status: DC

## 2021-10-02 ENCOUNTER — Other Ambulatory Visit: Payer: Self-pay

## 2021-10-02 ENCOUNTER — Ambulatory Visit (INDEPENDENT_AMBULATORY_CARE_PROVIDER_SITE_OTHER): Payer: 59 | Admitting: Nurse Practitioner

## 2021-10-02 ENCOUNTER — Encounter: Payer: Self-pay | Admitting: Nurse Practitioner

## 2021-10-02 VITALS — BP 130/88 | HR 85 | Temp 98.9°F | Ht 68.4 in | Wt 180.8 lb

## 2021-10-02 DIAGNOSIS — R630 Anorexia: Secondary | ICD-10-CM

## 2021-10-02 DIAGNOSIS — R82998 Other abnormal findings in urine: Secondary | ICD-10-CM

## 2021-10-02 DIAGNOSIS — Z720 Tobacco use: Secondary | ICD-10-CM

## 2021-10-02 DIAGNOSIS — K5909 Other constipation: Secondary | ICD-10-CM | POA: Diagnosis not present

## 2021-10-02 DIAGNOSIS — Z125 Encounter for screening for malignant neoplasm of prostate: Secondary | ICD-10-CM

## 2021-10-02 DIAGNOSIS — E7801 Familial hypercholesterolemia: Secondary | ICD-10-CM

## 2021-10-02 DIAGNOSIS — Z0000 Encounter for general adult medical examination without abnormal findings: Secondary | ICD-10-CM

## 2021-10-02 DIAGNOSIS — Z23 Encounter for immunization: Secondary | ICD-10-CM | POA: Diagnosis not present

## 2021-10-02 DIAGNOSIS — E663 Overweight: Secondary | ICD-10-CM

## 2021-10-02 DIAGNOSIS — I10 Essential (primary) hypertension: Secondary | ICD-10-CM

## 2021-10-02 DIAGNOSIS — Z114 Encounter for screening for human immunodeficiency virus [HIV]: Secondary | ICD-10-CM

## 2021-10-02 DIAGNOSIS — Z6827 Body mass index (BMI) 27.0-27.9, adult: Secondary | ICD-10-CM

## 2021-10-02 LAB — POCT URINALYSIS DIPSTICK
Bilirubin, UA: NEGATIVE
Glucose, UA: NEGATIVE
Ketones, UA: NEGATIVE
Nitrite, UA: NEGATIVE
Protein, UA: POSITIVE — AB
Spec Grav, UA: 1.025 (ref 1.010–1.025)
Urobilinogen, UA: 0.2 E.U./dL
pH, UA: 7 (ref 5.0–8.0)

## 2021-10-02 LAB — POCT UA - MICROALBUMIN
Creatinine, POC: 300 mg/dL
Microalbumin Ur, POC: 150 mg/L

## 2021-10-02 MED ORDER — TETANUS-DIPHTH-ACELL PERTUSSIS 5-2.5-18.5 LF-MCG/0.5 IM SUSY
0.5000 mL | PREFILLED_SYRINGE | Freq: Once | INTRAMUSCULAR | Status: AC
  Administered 2021-10-02: 0.5 mL via INTRAMUSCULAR

## 2021-10-02 NOTE — Patient Instructions (Signed)
Health Maintenance, Male Adopting a healthy lifestyle and getting preventive care are important in promoting health and wellness. Ask your health care provider about: The right schedule for you to have regular tests and exams. Things you can do on your own to prevent diseases and keep yourself healthy. What should I know about diet, weight, and exercise? Eat a healthy diet  Eat a diet that includes plenty of vegetables, fruits, low-fat dairy products, and lean protein. Do not eat a lot of foods that are high in solid fats, added sugars, or sodium. Maintain a healthy weight Body mass index (BMI) is a measurement that can be used to identify possible weight problems. It estimates body fat based on height and weight. Your health care provider can help determine your BMI and help you achieve or maintain a healthy weight. Get regular exercise Get regular exercise. This is one of the most important things you can do for your health. Most adults should: Exercise for at least 150 minutes each week. The exercise should increase your heart rate and make you sweat (moderate-intensity exercise). Do strengthening exercises at least twice a week. This is in addition to the moderate-intensity exercise. Spend less time sitting. Even light physical activity can be beneficial. Watch cholesterol and blood lipids Have your blood tested for lipids and cholesterol at 63 years of age, then have this test every 5 years. You may need to have your cholesterol levels checked more often if: Your lipid or cholesterol levels are high. You are older than 63 years of age. You are at high risk for heart disease. What should I know about cancer screening? Many types of cancers can be detected early and may often be prevented. Depending on your health history and family history, you may need to have cancer screening at various ages. This may include screening for: Colorectal cancer. Prostate cancer. Skin cancer. Lung  cancer. What should I know about heart disease, diabetes, and high blood pressure? Blood pressure and heart disease High blood pressure causes heart disease and increases the risk of stroke. This is more likely to develop in people who have high blood pressure readings, are of African descent, or are overweight. Talk with your health care provider about your target blood pressure readings. Have your blood pressure checked: Every 3-5 years if you are 18-39 years of age. Every year if you are 40 years old or older. If you are between the ages of 65 and 75 and are a current or former smoker, ask your health care provider if you should have a one-time screening for abdominal aortic aneurysm (AAA). Diabetes Have regular diabetes screenings. This checks your fasting blood sugar level. Have the screening done: Once every three years after age 45 if you are at a normal weight and have a low risk for diabetes. More often and at a younger age if you are overweight or have a high risk for diabetes. What should I know about preventing infection? Hepatitis B If you have a higher risk for hepatitis B, you should be screened for this virus. Talk with your health care provider to find out if you are at risk for hepatitis B infection. Hepatitis C Blood testing is recommended for: Everyone born from 1945 through 1965. Anyone with known risk factors for hepatitis C. Sexually transmitted infections (STIs) You should be screened each year for STIs, including gonorrhea and chlamydia, if: You are sexually active and are younger than 63 years of age. You are older than 63 years   of age and your health care provider tells you that you are at risk for this type of infection. Your sexual activity has changed since you were last screened, and you are at increased risk for chlamydia or gonorrhea. Ask your health care provider if you are at risk. Ask your health care provider about whether you are at high risk for HIV.  Your health care provider may recommend a prescription medicine to help prevent HIV infection. If you choose to take medicine to prevent HIV, you should first get tested for HIV. You should then be tested every 3 months for as long as you are taking the medicine. Follow these instructions at home: Lifestyle Do not use any products that contain nicotine or tobacco, such as cigarettes, e-cigarettes, and chewing tobacco. If you need help quitting, ask your health care provider. Do not use street drugs. Do not share needles. Ask your health care provider for help if you need support or information about quitting drugs. Alcohol use Do not drink alcohol if your health care provider tells you not to drink. If you drink alcohol: Limit how much you have to 0-2 drinks a day. Be aware of how much alcohol is in your drink. In the [place] one drink equals one 12 oz bottle of beer (355 mL), one 5 oz glass of wine (148 mL), or one 1 oz glass of hard liquor (44 mL). General instructions Schedule regular health, dental, and eye exams. Stay current with your vaccines. Tell your health care provider if: You often feel depressed. You have ever been abused or do not feel safe at home. Summary Adopting a healthy lifestyle and getting preventive care are important in promoting health and wellness. Follow your health care provider's instructions about healthy diet, exercising, and getting tested or screened for diseases. Follow your health care provider's instructions on monitoring your cholesterol and blood pressure. This information is not intended to replace advice given to you by your health care provider. Make sure you discuss any questions you have with your health care provider. Document Revised: 02/22/2021 Document Reviewed: 12/08/2018 Elsevier Patient Education  2022 Elsevier Inc.  

## 2021-10-02 NOTE — Progress Notes (Signed)
I,Katawbba Wiggins,acting as a Education administrator for [employer], FNP.,have documented all relevant documentation on the behalf of [name], FNP,as directed by  [name], FNP while in the presence of [name], [place].   This visit occurred during the SARS-CoV-2 public health emergency.  Safety protocols were in place, including screening questions prior to the visit, additional usage of staff PPE, and extensive cleaning of exam room while observing appropriate contact time as indicated for disinfecting solutions.  Subjective:     Patient ID: Larry Martin , male    DOB: 1956-04-02 , 65 y.o.   MRN: [id number]   Chief Complaint  Patient presents with   Annual Exam     HPI  The patient is here for a physical examination.  The patient states he hasn't been having an appetite. Reports he will eat and feel full. Reports having bowel movement daily but is better than before.   Wt Readings from Last 3 Encounters: 10/02/21 : 180 lb 12.8 oz (82 kg) 06/20/21 : 181 lb 9.6 oz (82.4 kg) 12/04/20 : 176 lb (79.8 kg)      Past Medical History:  Diagnosis Date   H. pylori infection    HLD (hyperlipidemia)      Family History  Problem Relation Age of Onset   Blindness Mother        not leagally blind   Colon cancer Neg Hx    Stomach cancer Neg Hx    Esophageal cancer Neg Hx    Inflammatory bowel disease Neg Hx    Liver disease Neg Hx    Pancreatic cancer Neg Hx    Rectal cancer Neg Hx      Current Outpatient Medications:    amLODipine (NORVASC) 5 MG tablet, Take 1 tablet (5 mg total) by mouth daily., Disp: 30 tablet, Rfl: 2   atorvastatin (LIPITOR) 10 MG tablet, Take 1 tablet (10 mg total) by mouth daily., Disp: 30 tablet, Rfl: 2   docusate sodium (COLACE) 100 MG capsule, Take 1 capsule (100 mg total) by mouth daily as needed., Disp: 30 capsule, Rfl: 1   Magnesium 250 MG TABS, Take 1 tablet (250 mg total) by mouth every evening. With evening meal, Disp: 90 tablet, Rfl: 0    omeprazole (PRILOSEC) 40 MG capsule, Take 1 capsule (40 mg total) by mouth 2 (two) times daily., Disp: 60 capsule, Rfl: 2   polyethylene glycol (MIRALAX) 17 g packet, Take 17 g by mouth daily., Disp: 14 each, Rfl: 0   No Known Allergies   Men's preventive visit. Patient Health Questionnaire (PHQ-2) is  [place] Office Visit from 11/13/2020 in [facility]  PHQ-2 Total Score 0     Patient is on a Regular diet. Exercises at home with stretching, push ups Marital status: Married. Relevant history for alcohol use is:  Social History   Substance and Sexual Activity  Alcohol Use No   Alcohol/week: 0.0 standard drinks   Relevant history for tobacco use is:  Social History   Tobacco Use  Smoking Status Every Day   Packs/day: 0.30   Years: 35.00   Pack years: 10.50   Types: Cigarettes  Smokeless Tobacco Never  Tobacco Comments   Smokes 1-3 cigarettes a day   .   Review of Systems  Constitutional: Negative.   HENT: Negative.    Eyes: Negative.   Respiratory: Negative.    Cardiovascular: Negative.   Gastrointestinal:  Positive for constipation. Negative for abdominal pain, diarrhea, nausea and vomiting.  Endocrine: Negative.   Genitourinary: Negative.   Musculoskeletal: Negative.   Skin: Negative.   Allergic/Immunologic: Negative.   Neurological: Negative.   Hematological: Negative.   Psychiatric/Behavioral: Negative.      Today's Vitals   10/02/21 1103  BP: 130/88  Pulse: 85  Temp: 98.9 F (37.2 C)  Weight: 180 lb 12.8 oz (82 kg)  Height: 5' 8.4" (1.737 m)   Body mass index is 27.17 kg/m.   Objective:  Physical Exam Vitals reviewed.  Constitutional:      General: He is not in acute distress.    Appearance: Normal appearance.  HENT:     Head: Normocephalic and atraumatic.     Right Ear: Tympanic membrane, ear canal and external ear normal. There is no impacted cerumen.     Left Ear: Tympanic membrane, ear canal and external ear  normal. There is no impacted cerumen.     Nose:     Comments: Deferred - masked    Mouth/Throat:     Comments: Deferred - masked Eyes:     Extraocular Movements: Extraocular movements intact.     Conjunctiva/sclera: Conjunctivae normal.     Pupils: Pupils are equal, round, and reactive to light.  Cardiovascular:     Rate and Rhythm: Normal rate and regular rhythm.     Pulses: Normal pulses.     Heart sounds: Normal heart sounds. No murmur heard. Pulmonary:     Effort: Pulmonary effort is normal. No respiratory distress.     Breath sounds: Normal breath sounds. No wheezing.  Abdominal:     General: Abdomen is flat. Bowel sounds are normal. There is no distension.     Palpations: Abdomen is soft.     Tenderness: There is no abdominal tenderness.  Genitourinary:    Prostate: Normal.     Rectum: Guaiac result negative.  Musculoskeletal:        General: Normal range of motion.     Cervical back: Normal range of motion and neck supple. No tenderness.  Skin:    General: Skin is warm and dry.     Capillary Refill: Capillary refill takes less than 2 seconds.  Neurological:     General: No focal deficit present.     Mental Status: He is alert and oriented to person, place, and time.     Cranial Nerves: No cranial nerve deficit.     Motor: No weakness.  Psychiatric:        Mood and Affect: Mood normal.        Behavior: Behavior normal.        Thought Content: Thought content normal.        Judgment: Judgment normal.        Assessment And Plan:    1. Encounter for physical examination Behavior modifications discussed and diet history reviewed.   Pt will continue to exercise regularly and modify diet with low GI, plant based foods and decrease intake of processed foods.  Recommend intake of daily multivitamin, Vitamin D, and calcium.  Recommend colonoscopy (up to date) for preventive screenings, as well as recommend immunizations that include influenza, TDAP, shingles (advised to  return for nurse visit for shingles vaccine) - VITAMIN D 25 Hydroxy (Vit-D Deficiency, Fractures)  2. Familial hypercholesterolemia - Lipid panel  3. Encounter for prostate cancer screening Manual prostate done which is normal - PSA  4. Encounter for HIV (human immunodeficiency virus) test - HIV antibody (with reflex)  5. Encounter for immunization Influenza vaccine administered Encouraged to take  Tylenol as needed for fever or muscle aches. Will give tetanus vaccine today while in office. Refer to order management. TDAP will be administered to adults 25-66 years old every 10 years. - Flu Vaccine QUAD 6+ mos PF IM (Fluarix Quad PF) - Tdap (BOOSTRIX) injection 0.5 mL  6. Overweight with body mass index (BMI) of 27 to 27.9 in adult  7. Primary hypertension Comments: Chronic, this is considered a new diagnosis since has continued to have an elevated blood pressure. Continue with amlodipine daily EKG done SR no changes from previous - POCT Urinalysis Dipstick (81002) - POCT UA - Microalbumin - EKG 12-Lead  8. Other constipation Comments: Continue stool softner and probiotic  9. Tobacco abuse Smoking cessation instruction/counseling given:  counseled patient on the dangers of tobacco use, advised patient to stop smoking, and reviewed strategies to maximize success   10. Decreased appetite Comments: I will check labs before considering an appetite stimulant, he has not lost any weight. He has had increased satiety since 2019, colonoscopy was done then - CBC - Hemoglobin A1c - CMP14+EGFR - TSH  11. Urine white blood cells increased Comments: Will send urine for culture - Culture, Urine    Patient was given opportunity to ask questions. Patient verbalized understanding of the plan and was able to repeat key elements of the plan. All questions were answered to their satisfaction.   [name], FNP   I, [name], FNP, have reviewed all documentation for this visit.  The documentation on 10/02/21 for the exam, diagnosis, procedures, and orders are all accurate and complete.   THE PATIENT IS ENCOURAGED TO PRACTICE SOCIAL DISTANCING DUE TO THE COVID-19 PANDEMIC.

## 2021-10-03 LAB — LIPID PANEL
Chol/HDL Ratio: 4.6 ratio (ref 0.0–5.0)
Cholesterol, Total: 203 mg/dL — ABNORMAL HIGH (ref 100–199)
HDL: 44 mg/dL (ref >39)
LDL Chol Calc (NIH): 132 mg/dL — ABNORMAL HIGH (ref 0–99)
Triglycerides: 148 mg/dL (ref 0–149)
VLDL Cholesterol Cal: 27 mg/dL (ref 5–40)

## 2021-10-03 LAB — CBC
Hematocrit: 48.8 % (ref 37.5–51.0)
Hemoglobin: 15 g/dL (ref 13.0–17.7)
MCH: 23.3 pg — ABNORMAL LOW (ref 26.6–33.0)
MCHC: 30.7 g/dL — ABNORMAL LOW (ref 31.5–35.7)
MCV: 76 fL — ABNORMAL LOW (ref 79–97)
Platelets: 220 10*3/uL (ref 150–450)
RBC: 6.43 x10E6/uL — ABNORMAL HIGH (ref 4.14–5.80)
RDW: 14.1 % (ref 11.6–15.4)
WBC: 4.3 10*3/uL (ref 3.4–10.8)

## 2021-10-03 LAB — VITAMIN D 25 HYDROXY (VIT D DEFICIENCY, FRACTURES): Vit D, 25-Hydroxy: 22.3 ng/mL — ABNORMAL LOW (ref 30.0–100.0)

## 2021-10-03 LAB — HEMOGLOBIN A1C
Est. average glucose Bld gHb Est-mCnc: 114 mg/dL
Hgb A1c MFr Bld: 5.6 % (ref 4.8–5.6)

## 2021-10-03 LAB — TSH: TSH: 1.27 u[IU]/mL (ref 0.450–4.500)

## 2021-10-03 LAB — PSA: Prostate Specific Ag, Serum: 14.9 ng/mL — ABNORMAL HIGH (ref 0.0–4.0)

## 2021-10-03 LAB — HIV ANTIBODY (ROUTINE TESTING W REFLEX): HIV Screen 4th Generation wRfx: NONREACTIVE

## 2021-10-04 ENCOUNTER — Other Ambulatory Visit: Payer: Self-pay | Admitting: Nurse Practitioner

## 2021-10-04 DIAGNOSIS — E559 Vitamin D deficiency, unspecified: Secondary | ICD-10-CM

## 2021-10-04 MED ORDER — VITAMIN D (ERGOCALCIFEROL) 1.25 MG (50000 UNIT) PO CAPS: 50000.0000 [IU] | ORAL_CAPSULE | ORAL | 1 refills | Status: DC

## 2021-10-07 LAB — URINE CULTURE

## 2021-10-08 ENCOUNTER — Other Ambulatory Visit: Payer: Self-pay | Admitting: Nurse Practitioner

## 2021-10-08 DIAGNOSIS — N390 Urinary tract infection, site not specified: Secondary | ICD-10-CM

## 2021-10-08 LAB — SPECIMEN STATUS REPORT

## 2021-10-08 LAB — CMP14+EGFR
ALT: 20 IU/L (ref 0–44)
AST: 22 IU/L (ref 0–40)
Albumin/Globulin Ratio: 2.1 (ref 1.2–2.2)
Albumin: 4.8 g/dL (ref 3.8–4.8)
Alkaline Phosphatase: 38 IU/L — ABNORMAL LOW (ref 44–121)
BUN/Creatinine Ratio: 7 — ABNORMAL LOW (ref 10–24)
BUN: 9 mg/dL (ref 8–27)
Bilirubin Total: 0.3 mg/dL (ref 0.0–1.2)
CO2: 20 mmol/L (ref 20–29)
Calcium: 9.7 mg/dL (ref 8.6–10.2)
Chloride: 100 mmol/L (ref 96–106)
Creatinine, Ser: 1.29 mg/dL — ABNORMAL HIGH (ref 0.76–1.27)
Globulin, Total: 2.3 g/dL (ref 1.5–4.5)
Glucose: 92 mg/dL (ref 70–99)
Potassium: 4.5 mmol/L (ref 3.5–5.2)
Sodium: 141 mmol/L (ref 134–144)
Total Protein: 7.1 g/dL (ref 6.0–8.5)
eGFR: 63 mL/min/{1.73_m2} (ref >59)

## 2021-10-08 MED ORDER — CIPROFLOXACIN HCL 500 MG PO TABS: 500.0000 mg | ORAL_TABLET | Freq: Two times a day (BID) | ORAL | 0 refills | Status: AC

## 2021-10-28 ENCOUNTER — Other Ambulatory Visit: Payer: Self-pay | Admitting: Nurse Practitioner

## 2021-10-28 ENCOUNTER — Other Ambulatory Visit: Payer: 59

## 2021-10-28 ENCOUNTER — Other Ambulatory Visit: Payer: Self-pay

## 2021-10-28 DIAGNOSIS — R972 Elevated prostate specific antigen [PSA]: Secondary | ICD-10-CM

## 2021-10-28 DIAGNOSIS — N289 Disorder of kidney and ureter, unspecified: Secondary | ICD-10-CM

## 2021-10-29 LAB — BMP8+EGFR
BUN/Creatinine Ratio: 7 — ABNORMAL LOW (ref 10–24)
BUN: 8 mg/dL (ref 8–27)
CO2: 23 mmol/L (ref 20–29)
Calcium: 9.6 mg/dL (ref 8.6–10.2)
Chloride: 100 mmol/L (ref 96–106)
Creatinine, Ser: 1.16 mg/dL (ref 0.76–1.27)
Glucose: 138 mg/dL — ABNORMAL HIGH (ref 70–99)
Potassium: 4 mmol/L (ref 3.5–5.2)
Sodium: 136 mmol/L (ref 134–144)
eGFR: 71 mL/min/{1.73_m2} (ref >59)

## 2021-10-29 LAB — PSA: Prostate Specific Ag, Serum: 4.9 ng/mL — ABNORMAL HIGH (ref 0.0–4.0)

## 2022-04-07 ENCOUNTER — Ambulatory Visit: Payer: 59 | Admitting: Internal Medicine

## 2022-04-15 ENCOUNTER — Encounter: Payer: Self-pay | Admitting: Nurse Practitioner

## 2022-04-15 ENCOUNTER — Ambulatory Visit (INDEPENDENT_AMBULATORY_CARE_PROVIDER_SITE_OTHER): Payer: 59 | Admitting: Nurse Practitioner

## 2022-04-15 VITALS — BP 130/82 | HR 83 | Temp 98.4°F | Ht 68.0 in | Wt 185.4 lb

## 2022-04-15 DIAGNOSIS — E559 Vitamin D deficiency, unspecified: Secondary | ICD-10-CM

## 2022-04-15 DIAGNOSIS — K219 Gastro-esophageal reflux disease without esophagitis: Secondary | ICD-10-CM | POA: Diagnosis not present

## 2022-04-15 DIAGNOSIS — E7801 Familial hypercholesterolemia: Secondary | ICD-10-CM | POA: Diagnosis not present

## 2022-04-15 DIAGNOSIS — R630 Anorexia: Secondary | ICD-10-CM

## 2022-04-15 DIAGNOSIS — Z23 Encounter for immunization: Secondary | ICD-10-CM | POA: Diagnosis not present

## 2022-04-15 DIAGNOSIS — K5904 Chronic idiopathic constipation: Secondary | ICD-10-CM

## 2022-04-15 DIAGNOSIS — I10 Essential (primary) hypertension: Secondary | ICD-10-CM

## 2022-04-15 MED ORDER — VITAMIN D (ERGOCALCIFEROL) 1.25 MG (50000 UNIT) PO CAPS: 50000.0000 [IU] | ORAL_CAPSULE | ORAL | 1 refills | Status: DC

## 2022-04-15 MED ORDER — OMEPRAZOLE 40 MG PO CPDR: 40.0000 mg | DELAYED_RELEASE_CAPSULE | Freq: Two times a day (BID) | ORAL | 1 refills | Status: DC

## 2022-04-15 MED ORDER — AMLODIPINE BESYLATE 5 MG PO TABS: 5.0000 mg | ORAL_TABLET | Freq: Every day | ORAL | 1 refills | Status: DC

## 2022-04-15 MED ORDER — ATORVASTATIN CALCIUM 10 MG PO TABS: 10.0000 mg | ORAL_TABLET | Freq: Every day | ORAL | 1 refills | Status: DC

## 2022-04-15 NOTE — Progress Notes (Signed)
?Industrial/product designer as a Education administrator for [employer], FNP.,have documented all relevant documentation on the behalf of [name], FNP,as directed by  [name], FNP while in the presence of [name], [place]. ? ?This visit occurred during the SARS-CoV-2 public health emergency.  Safety protocols were in place, including screening questions prior to the visit, additional usage of staff PPE, and extensive cleaning of exam room while observing appropriate contact time as indicated for disinfecting solutions. ? ?Subjective:  ?  ? Patient ID: Larry Martin , male    DOB: 02/15/1956 , 66 y.o.   MRN: [id number] ? ? ?Chief Complaint  ?Patient presents with  ? Hypertension  ? ? ?HPI ? ?Patient presents today for htn follow up.  ?  ? ?Past Medical History:  ?Diagnosis Date  ? H. pylori infection   ? HLD (hyperlipidemia)   ?  ? ?Family History  ?Problem Relation Age of Onset  ? Blindness Mother   ?     not leagally blind  ? Colon cancer Neg Hx   ? Stomach cancer Neg Hx   ? Esophageal cancer Neg Hx   ? Inflammatory bowel disease Neg Hx   ? Liver disease Neg Hx   ? Pancreatic cancer Neg Hx   ? Rectal cancer Neg Hx   ? ? ? ?Current Outpatient Medications:  ?  Magnesium 250 MG TABS, Take 1 tablet (250 mg total) by mouth every evening. With evening meal, Disp: 90 tablet, Rfl: 0 ?  amLODipine (NORVASC) 5 MG tablet, Take 1 tablet (5 mg total) by mouth daily., Disp: 30 tablet, Rfl: 1 ?  atorvastatin (LIPITOR) 10 MG tablet, Take 1 tablet (10 mg total) by mouth daily., Disp: 30 tablet, Rfl: 1 ?  omeprazole (PRILOSEC) 40 MG capsule, Take 1 capsule (40 mg total) by mouth 2 (two) times daily., Disp: 90 capsule, Rfl: 1 ?  Vitamin D, Ergocalciferol, (DRISDOL) 1.25 MG (50000 UNIT) CAPS capsule, Take 1 capsule (50,000 Units total) by mouth every 7 (seven) days., Disp: 12 capsule, Rfl: 1  ? ?No Known Allergies  ? ?Review of Systems  ?Constitutional: Negative.   ?Respiratory: Negative.    ?Cardiovascular: Negative.   ?Gastrointestinal:   Positive for constipation.  ?Neurological: Negative.    ? ?Today's Vitals  ? 04/15/22 1509  ?BP: 130/82  ?Pulse: 83  ?Temp: 98.4 ?F (36.9 ?C)  ?SpO2: 98%  ?Weight: 185 lb 6.4 oz (84.1 kg)  ?Height: 5' 8"  (1.727 m)  ? ?Body mass index is 28.19 kg/m?.  ?Wt Readings from Last 3 Encounters:  ?04/15/22 185 lb 6.4 oz (84.1 kg)  ?10/02/21 180 lb 12.8 oz (82 kg)  ?06/20/21 181 lb 9.6 oz (82.4 kg)  ?  ?Objective:  ?Physical Exam ?Vitals reviewed.  ?Constitutional:   ?   General: He is not in acute distress. ?   Appearance: Normal appearance. He is well-developed.  ?Cardiovascular:  ?   Rate and Rhythm: Normal rate and regular rhythm.  ?   Pulses: Normal pulses.  ?   Heart sounds: Normal heart sounds. No murmur heard. ?Pulmonary:  ?   Effort: Pulmonary effort is normal. No respiratory distress.  ?   Breath sounds: Normal breath sounds.  ?Abdominal:  ?   General: Bowel sounds are normal.  ?   Tenderness: There is no abdominal tenderness. There is no right CVA tenderness.  ?Skin: ?   General: Skin is warm and dry.  ?   Capillary Refill: Capillary refill takes less than 2 seconds.  ?  Neurological:  ?   General: No focal deficit present.  ?   Mental Status: He is alert and oriented to person, place, and time.  ?   Cranial Nerves: No cranial nerve deficit.  ?Psychiatric:     ?   Mood and Affect: Mood normal.     ?   Behavior: Behavior normal.     ?   Thought Content: Thought content normal.     ?   Judgment: Judgment normal.  ?  ? ?   ?Assessment And Plan:  ?   ?1. Primary hypertension ?Comments: blood pressure is fairly controlled, continue current medications ?- BMP8+EGFR ?- amLODipine (NORVASC) 5 MG tablet; Take 1 tablet (5 mg total) by mouth daily.  Dispense: 30 tablet; Refill: 1 ? ?2. Familial hypercholesterolemia ?- Lipid panel ?- atorvastatin (LIPITOR) 10 MG tablet; Take 1 tablet (10 mg total) by mouth daily.  Dispense: 30 tablet; Refill: 1 ? ?3. Immunization due ?Shingrix #1 given in office ?- Varicella-zoster vaccine IM  (Shingrix) ? ?4. Gastroesophageal reflux disease ?- omeprazole (PRILOSEC) 40 MG capsule; Take 1 capsule (40 mg total) by mouth 2 (two) times daily.  Dispense: 90 capsule; Refill: 1 ? ?5. Vitamin D deficiency ?Will check vitamin D level and supplement as needed.    ?Also encouraged to spend 15 minutes in the sun daily.  ?- VITAMIN D 25 Hydroxy (Vit-D Deficiency, Fractures) ?- Vitamin D, Ergocalciferol, (DRISDOL) 1.25 MG (50000 UNIT) CAPS capsule; Take 1 capsule (50,000 Units total) by mouth every 7 (seven) days.  Dispense: 12 capsule; Refill: 1 ? ?6. Decreased appetite ?Comments: May be related to his chronic constipation however he has not lost any significant weight ?- Hemoglobin A1c ?- TSH ? ?7. Chronic idiopathic constipation ?Comments: He is encouraged to make sure to take a fiber supplement, if this does not improve will try Linzess or Amitiza. He has been evaluated by GI  ?  ? ? ?Patient was given opportunity to ask questions. Patient verbalized understanding of the plan and was able to repeat key elements of the plan. All questions were answered to their satisfaction.  ?[name], FNP  ? ?I, [name], FNP, have reviewed all documentation for this visit. The documentation on 04/15/22 for the exam, diagnosis, procedures, and orders are all accurate and complete.  ? ?IF YOU HAVE BEEN REFERRED TO A SPECIALIST, IT MAY TAKE 1-2 WEEKS TO SCHEDULE/PROCESS THE REFERRAL. IF YOU HAVE NOT HEARD FROM US/SPECIALIST IN TWO WEEKS, PLEASE GIVE [place] A CALL AT [phone].  ? ?THE PATIENT IS ENCOURAGED TO PRACTICE SOCIAL DISTANCING DUE TO THE COVID-19 PANDEMIC.   ?

## 2022-04-15 NOTE — Patient Instructions (Signed)
Hypertension, Adult High blood pressure (hypertension) is when the force of blood pumping through the arteries is too strong. The arteries are the blood vessels that carry blood from the heart throughout the body. Hypertension forces the heart to work harder to pump blood and may cause arteries to become narrow or stiff. Untreated or uncontrolled hypertension can lead to a heart attack, heart failure, a stroke, kidney disease, and other problems. A blood pressure reading consists of a higher number over a lower number. Ideally, your blood pressure should be below 120/80. The first ("top") number is called the systolic pressure. It is a measure of the pressure in your arteries as your heart beats. The second ("bottom") number is called the diastolic pressure. It is a measure of the pressure in your arteries as the heart relaxes. What are the causes? The exact cause of this condition is not known. There are some conditions that result in high blood pressure. What increases the risk? Certain factors may make you more likely to develop high blood pressure. Some of these risk factors are under your control, including: Smoking. Not getting enough exercise or physical activity. Being overweight. Having too much fat, sugar, calories, or salt (sodium) in your diet. Drinking too much alcohol. Other risk factors include: Having a personal history of heart disease, diabetes, high cholesterol, or kidney disease. Stress. Having a family history of high blood pressure and high cholesterol. Having obstructive sleep apnea. Age. The risk increases with age. What are the signs or symptoms? High blood pressure may not cause symptoms. Very high blood pressure (hypertensive crisis) may cause: Headache. Fast or irregular heartbeats (palpitations). Shortness of breath. Nosebleed. Nausea and vomiting. Vision changes. Severe chest pain, dizziness, and seizures. How is this diagnosed? This condition is diagnosed by  measuring your blood pressure while you are seated, with your arm resting on a flat surface, your legs uncrossed, and your feet flat on the floor. The cuff of the blood pressure monitor will be placed directly against the skin of your upper arm at the level of your heart. Blood pressure should be measured at least twice using the same arm. Certain conditions can cause a difference in blood pressure between your right and left arms. If you have a high blood pressure reading during one visit or you have normal blood pressure with other risk factors, you may be asked to: Return on a different day to have your blood pressure checked again. Monitor your blood pressure at home for 1 week or longer. If you are diagnosed with hypertension, you may have other blood or imaging tests to help your health care provider understand your overall risk for other conditions. How is this treated? This condition is treated by making healthy lifestyle changes, such as eating healthy foods, exercising more, and reducing your alcohol intake. You may be referred for counseling on a healthy diet and physical activity. Your health care provider may prescribe medicine if lifestyle changes are not enough to get your blood pressure under control and if: Your systolic blood pressure is above 130. Your diastolic blood pressure is above 80. Your personal target blood pressure may vary depending on your medical conditions, your age, and other factors. Follow these instructions at home: Eating and drinking  Eat a diet that is high in fiber and potassium, and low in sodium, added sugar, and fat. An example of this eating plan is called the DASH diet. DASH stands for Dietary Approaches to Stop Hypertension. To eat this way: Eat   plenty of fresh fruits and vegetables. Try to fill one half of your plate at each meal with fruits and vegetables. Eat whole grains, such as whole-wheat pasta, brown rice, or whole-grain bread. Fill about one  fourth of your plate with whole grains. Eat or drink low-fat dairy products, such as skim milk or low-fat yogurt. Avoid fatty cuts of meat, processed or cured meats, and poultry with skin. Fill about one fourth of your plate with lean proteins, such as fish, chicken without skin, beans, eggs, or tofu. Avoid pre-made and processed foods. These tend to be higher in sodium, added sugar, and fat. Reduce your daily sodium intake. Many people with hypertension should eat less than 1,500 mg of sodium a day. Do not drink alcohol if: Your health care provider tells you not to drink. You are pregnant, may be pregnant, or are planning to become pregnant. If you drink alcohol: Limit how much you have to: 0-1 drink a day for women. 0-2 drinks a day for men. Know how much alcohol is in your drink. In the [place] one drink equals one 12 oz bottle of beer (355 mL), one 5 oz glass of wine (148 mL), or one 1 oz glass of hard liquor (44 mL). Lifestyle  Work with your health care provider to maintain a healthy body weight or to lose weight. Ask what an ideal weight is for you. Get at least 30 minutes of exercise that causes your heart to beat faster (aerobic exercise) most days of the week. Activities may include walking, swimming, or biking. Include exercise to strengthen your muscles (resistance exercise), such as Pilates or lifting weights, as part of your weekly exercise routine. Try to do these types of exercises for 30 minutes at least 3 days a week. Do not use any products that contain nicotine or tobacco. These products include cigarettes, chewing tobacco, and vaping devices, such as e-cigarettes. If you need help quitting, ask your health care provider. Monitor your blood pressure at home as told by your health care provider. Keep all follow-up visits. This is important. Medicines Take over-the-counter and prescription medicines only as told by your health care provider. Follow directions carefully. Blood  pressure medicines must be taken as prescribed. Do not skip doses of blood pressure medicine. Doing this puts you at risk for problems and can make the medicine less effective. Ask your health care provider about side effects or reactions to medicines that you should watch for. Contact a health care provider if you: Think you are having a reaction to a medicine you are taking. Have headaches that keep coming back (recurring). Feel dizzy. Have swelling in your ankles. Have trouble with your vision. Get help right away if you: Develop a severe headache or confusion. Have unusual weakness or numbness. Feel faint. Have severe pain in your chest or abdomen. Vomit repeatedly. Have trouble breathing. These symptoms may be an emergency. Get help right away. Call 911. Do not wait to see if the symptoms will go away. Do not drive yourself to the hospital. Summary Hypertension is when the force of blood pumping through your arteries is too strong. If this condition is not controlled, it may put you at risk for serious complications. Your personal target blood pressure may vary depending on your medical conditions, your age, and other factors. For most people, a normal blood pressure is less than 120/80. Hypertension is treated with lifestyle changes, medicines, or a combination of both. Lifestyle changes include losing weight, eating a healthy,   low-sodium diet, exercising more, and limiting alcohol. This information is not intended to replace advice given to you by your health care provider. Make sure you discuss any questions you have with your health care provider. Document Revised: 10/22/2021 Document Reviewed: 10/22/2021 Elsevier Patient Education  2023 Elsevier Inc.  

## 2022-04-16 LAB — VITAMIN D 25 HYDROXY (VIT D DEFICIENCY, FRACTURES): Vit D, 25-Hydroxy: 21.3 ng/mL — ABNORMAL LOW (ref 30.0–100.0)

## 2022-04-16 LAB — BMP8+EGFR
BUN/Creatinine Ratio: 8 — ABNORMAL LOW (ref 10–24)
BUN: 10 mg/dL (ref 8–27)
CO2: 22 mmol/L (ref 20–29)
Calcium: 9.2 mg/dL (ref 8.6–10.2)
Chloride: 107 mmol/L — ABNORMAL HIGH (ref 96–106)
Creatinine, Ser: 1.23 mg/dL (ref 0.76–1.27)
Glucose: 155 mg/dL — ABNORMAL HIGH (ref 70–99)
Potassium: 4 mmol/L (ref 3.5–5.2)
Sodium: 145 mmol/L — ABNORMAL HIGH (ref 134–144)
eGFR: 66 mL/min/{1.73_m2} (ref >59)

## 2022-04-16 LAB — LIPID PANEL
Chol/HDL Ratio: 4.7 ratio (ref 0.0–5.0)
Cholesterol, Total: 178 mg/dL (ref 100–199)
HDL: 38 mg/dL — ABNORMAL LOW (ref >39)
LDL Chol Calc (NIH): 96 mg/dL (ref 0–99)
Triglycerides: 263 mg/dL — ABNORMAL HIGH (ref 0–149)
VLDL Cholesterol Cal: 44 mg/dL — ABNORMAL HIGH (ref 5–40)

## 2022-04-16 LAB — TSH: TSH: 1.21 u[IU]/mL (ref 0.450–4.500)

## 2022-04-16 LAB — HEMOGLOBIN A1C
Est. average glucose Bld gHb Est-mCnc: 114 mg/dL
Hgb A1c MFr Bld: 5.6 % (ref 4.8–5.6)

## 2022-06-17 ENCOUNTER — Ambulatory Visit: Payer: 59

## 2022-07-22 ENCOUNTER — Encounter: Payer: Self-pay | Admitting: Nurse Practitioner

## 2022-07-22 ENCOUNTER — Ambulatory Visit (INDEPENDENT_AMBULATORY_CARE_PROVIDER_SITE_OTHER): Payer: 59 | Admitting: Nurse Practitioner

## 2022-07-22 VITALS — BP 120/80 | HR 90 | Temp 98.1°F | Ht 68.0 in | Wt 185.2 lb

## 2022-07-22 DIAGNOSIS — E559 Vitamin D deficiency, unspecified: Secondary | ICD-10-CM

## 2022-07-22 DIAGNOSIS — K219 Gastro-esophageal reflux disease without esophagitis: Secondary | ICD-10-CM | POA: Diagnosis not present

## 2022-07-22 DIAGNOSIS — R972 Elevated prostate specific antigen [PSA]: Secondary | ICD-10-CM

## 2022-07-22 DIAGNOSIS — I10 Essential (primary) hypertension: Secondary | ICD-10-CM | POA: Diagnosis not present

## 2022-07-22 DIAGNOSIS — E7801 Familial hypercholesterolemia: Secondary | ICD-10-CM

## 2022-07-22 DIAGNOSIS — K5904 Chronic idiopathic constipation: Secondary | ICD-10-CM

## 2022-07-22 MED ORDER — VITAMIN D (ERGOCALCIFEROL) 1.25 MG (50000 UNIT) PO CAPS: 50000.0000 [IU] | ORAL_CAPSULE | ORAL | 1 refills | Status: DC

## 2022-07-22 MED ORDER — ATORVASTATIN CALCIUM 10 MG PO TABS: 10.0000 mg | ORAL_TABLET | Freq: Every day | ORAL | 1 refills | Status: DC

## 2022-07-22 MED ORDER — AMLODIPINE BESYLATE 5 MG PO TABS: 5.0000 mg | ORAL_TABLET | Freq: Every day | ORAL | 1 refills | Status: DC

## 2022-07-22 NOTE — Patient Instructions (Signed)
Hypertension, Adult ?Hypertension is another name for high blood pressure. High blood pressure forces your heart to work harder to pump blood. This can cause problems over time. ?There are two numbers in a blood pressure reading. There is a top number (systolic) over a bottom number (diastolic). It is best to have a blood pressure that is below 120/80. ?What are the causes? ?The cause of this condition is not known. Some other conditions can lead to high blood pressure. ?What increases the risk? ?Some lifestyle factors can make you more likely to develop high blood pressure: ?Smoking. ?Not getting enough exercise or physical activity. ?Being overweight. ?Having too much fat, sugar, calories, or salt (sodium) in your diet. ?Drinking too much alcohol. ?Other risk factors include: ?Having any of these conditions: ?Heart disease. ?Diabetes. ?High cholesterol. ?Kidney disease. ?Obstructive sleep apnea. ?Having a family history of high blood pressure and high cholesterol. ?Age. The risk increases with age. ?Stress. ?What are the signs or symptoms? ?High blood pressure may not cause symptoms. Very high blood pressure (hypertensive crisis) may cause: ?Headache. ?Fast or uneven heartbeats (palpitations). ?Shortness of breath. ?Nosebleed. ?Vomiting or feeling like you may vomit (nauseous). ?Changes in how you see. ?Very bad chest pain. ?Feeling dizzy. ?Seizures. ?How is this treated? ?This condition is treated by making healthy lifestyle changes, such as: ?Eating healthy foods. ?Exercising more. ?Drinking less alcohol. ?Your doctor may prescribe medicine if lifestyle changes do not help enough and if: ?Your top number is above 130. ?Your bottom number is above 80. ?Your personal target blood pressure may vary. ?Follow these instructions at home: ?Eating and drinking ? ?If told, follow the DASH eating plan. To follow this plan: ?Fill one half of your plate at each meal with fruits and vegetables. ?Fill one fourth of your plate  at each meal with whole grains. Whole grains include whole-wheat pasta, brown rice, and whole-grain bread. ?Eat or drink low-fat dairy products, such as skim milk or low-fat yogurt. ?Fill one fourth of your plate at each meal with low-fat (lean) proteins. Low-fat proteins include fish, chicken without skin, eggs, beans, and tofu. ?Avoid fatty meat, cured and processed meat, or chicken with skin. ?Avoid pre-made or processed food. ?Limit the amount of salt in your diet to less than 1,500 mg each day. ?Do not drink alcohol if: ?Your doctor tells you not to drink. ?You are pregnant, may be pregnant, or are planning to become pregnant. ?If you drink alcohol: ?Limit how much you have to: ?0-1 drink a day for women. ?0-2 drinks a day for men. ?Know how much alcohol is in your drink. In the [place] one drink equals one 12 oz bottle of beer (355 mL), one 5 oz glass of wine (148 mL), or one 1? oz glass of hard liquor (44 mL). ?Lifestyle ? ?Work with your doctor to stay at a healthy weight or to lose weight. Ask your doctor what the best weight is for you. ?Get at least 30 minutes of exercise that causes your heart to beat faster (aerobic exercise) most days of the week. This may include walking, swimming, or biking. ?Get at least 30 minutes of exercise that strengthens your muscles (resistance exercise) at least 3 days a week. This may include lifting weights or doing Pilates. ?Do not smoke or use any products that contain nicotine or tobacco. If you need help quitting, ask your doctor. ?Check your blood pressure at home as told by your doctor. ?Keep all follow-up visits. ?Medicines ?Take over-the-counter and prescription medicines   only as told by your doctor. Follow directions carefully. ?Do not skip doses of blood pressure medicine. The medicine does not work as well if you skip doses. Skipping doses also puts you at risk for problems. ?Ask your doctor about side effects or reactions to medicines that you should watch  for. ?Contact a doctor if: ?You think you are having a reaction to the medicine you are taking. ?You have headaches that keep coming back. ?You feel dizzy. ?You have swelling in your ankles. ?You have trouble with your vision. ?Get help right away if: ?You get a very bad headache. ?You start to feel mixed up (confused). ?You feel weak or numb. ?You feel faint. ?You have very bad pain in your: ?Chest. ?Belly (abdomen). ?You vomit more than once. ?You have trouble breathing. ?These symptoms may be an emergency. Get help right away. Call 911. ?Do not wait to see if the symptoms will go away. ?Do not drive yourself to the hospital. ?Summary ?Hypertension is another name for high blood pressure. ?High blood pressure forces your heart to work harder to pump blood. ?For most people, a normal blood pressure is less than 120/80. ?Making healthy choices can help lower blood pressure. If your blood pressure does not get lower with healthy choices, you may need to take medicine. ?This information is not intended to replace advice given to you by your health care provider. Make sure you discuss any questions you have with your health care provider. ?Document Revised: 10/03/2021 Document Reviewed: 10/03/2021 ?Elsevier Patient Education ? 2023 Elsevier Inc. ? ?

## 2022-07-22 NOTE — Progress Notes (Signed)
I,Victoria T Hamilton,acting as a Education administrator for [name], FNP.,have documented all relevant documentation on the behalf of [name], FNP,as directed by  [name], FNP while in the presence of [name], [facility].    Subjective:     Patient ID: Larry Martin , male    DOB: 25-Nov-1956 , 66 y.o.   MRN: [id number]   Chief Complaint  Patient presents with   Hypertension    HPI  Pt presents today for BPC. Reports compliance with medications.  His last shingrix vaccine caused him to feel fatigued does not want the second dose  Hypertension This is a chronic problem. The current episode started more than 1 year ago. The problem is unchanged. The problem is controlled. Pertinent negatives include no anxiety. There are no associated agents to hypertension. Risk factors for coronary artery disease include male gender. Past treatments include calcium channel blockers.     Past Medical History:  Diagnosis Date   H. pylori infection    HLD (hyperlipidemia)      Family History  Problem Relation Age of Onset   Blindness Mother        not leagally blind   Colon cancer Neg Hx    Stomach cancer Neg Hx    Esophageal cancer Neg Hx    Inflammatory bowel disease Neg Hx    Liver disease Neg Hx    Pancreatic cancer Neg Hx    Rectal cancer Neg Hx      Current Outpatient Medications:    Magnesium 250 MG TABS, Take 1 tablet (250 mg total) by mouth every evening. With evening meal, Disp: 90 tablet, Rfl: 0   omeprazole (PRILOSEC) 40 MG capsule, Take 1 capsule (40 mg total) by mouth 2 (two) times daily., Disp: 90 capsule, Rfl: 1   amLODipine (NORVASC) 5 MG tablet, Take 1 tablet (5 mg total) by mouth daily., Disp: 90 tablet, Rfl: 1   atorvastatin (LIPITOR) 10 MG tablet, Take 1 tablet (10 mg total) by mouth daily., Disp: 90 tablet, Rfl: 1   Vitamin D, Ergocalciferol, (DRISDOL) 1.25 MG (50000 UNIT) CAPS capsule, Take 1 capsule (50,000 Units total) by mouth every 7 (seven) days., Disp: 12  capsule, Rfl: 1   No Known Allergies   Review of Systems  Constitutional: Negative.   HENT: Negative.    Gastrointestinal: Negative.   Endocrine: Negative.   Genitourinary: Negative.   Musculoskeletal: Negative.   Skin: Negative.   Allergic/Immunologic: Negative.   Neurological: Negative.   Hematological: Negative.      Today's Vitals   07/22/22 1551  BP: 120/80  Pulse: 90  Temp: 98.1 F (36.7 C)  SpO2: 97%  Weight: 185 lb 3.2 oz (84 kg)  Height: _0  (1.727 m)  PainSc: 0-No pain   Body mass index is 28.16 kg/m.  Wt Readings from Last 3 Encounters:  07/22/22 185 lb 3.2 oz (84 kg)  04/15/22 185 lb 6.4 oz (84.1 kg)  10/02/21 180 lb 12.8 oz (82 kg)    Objective:  Physical Exam Vitals reviewed.  Constitutional:      General: He is not in acute distress.    Appearance: Normal appearance.  Cardiovascular:     Rate and Rhythm: Normal rate and regular rhythm.     Pulses: Normal pulses.     Heart sounds: Normal heart sounds. No murmur heard. Pulmonary:     Effort: No respiratory distress.     Breath sounds: Normal breath sounds.  Musculoskeletal:  General: No swelling or tenderness.  Skin:    General: Skin is warm and dry.     Capillary Refill: Capillary refill takes less than 2 seconds.  Neurological:     General: No focal deficit present.     Mental Status: He is alert and oriented to person, place, and time.     Cranial Nerves: No cranial nerve deficit.     Motor: No weakness.  Psychiatric:        Mood and Affect: Mood normal.        Behavior: Behavior normal.        Thought Content: Thought content normal.        Judgment: Judgment normal.         Assessment And Plan:     1. Primary hypertension Comments: Blood pressure is better controlled, continue current medications - amLODipine (NORVASC) 5 MG tablet; Take 1 tablet (5 mg total) by mouth daily.  Dispense: 90 tablet; Refill: 1 - BMP8+eGFR  2. Familial hypercholesterolemia - atorvastatin  (LIPITOR) 10 MG tablet; Take 1 tablet (10 mg total) by mouth daily.  Dispense: 90 tablet; Refill: 1  3. Gastroesophageal reflux disease, unspecified whether esophagitis present Comments: Doing better with omeprazole  4. Elevated PSA Comments: Improved at last check if remains elevated will refer to Urology - PSA  5. Vitamin D deficiency Will check vitamin D level and supplement as needed.    Also encouraged to spend 15 minutes in the sun daily.  - Vitamin D, Ergocalciferol, (DRISDOL) 1.25 MG (50000 UNIT) CAPS capsule; Take 1 capsule (50,000 Units total) by mouth every 7 (seven) days.  Dispense: 12 capsule; Refill: 1  6. Chronic idiopathic constipation Comments: He is doing better with his constipation. He is not as bloated.      Patient was given opportunity to ask questions. Patient verbalized understanding of the plan and was able to repeat key elements of the plan. All questions were answered to their satisfaction.  [name], FNP   I, [name], FNP, have reviewed all documentation for this visit. The documentation on 07/22/22 for the exam, diagnosis, procedures, and orders are all accurate and complete.   IF YOU HAVE BEEN REFERRED TO A SPECIALIST, IT MAY TAKE 1-2 WEEKS TO SCHEDULE/PROCESS THE REFERRAL. IF YOU HAVE NOT HEARD FROM US/SPECIALIST IN TWO WEEKS, PLEASE GIVE [place] A CALL AT [phone].   THE PATIENT IS ENCOURAGED TO PRACTICE SOCIAL DISTANCING DUE TO THE COVID-19 PANDEMIC.

## 2022-07-23 ENCOUNTER — Other Ambulatory Visit: Payer: Self-pay | Admitting: Nurse Practitioner

## 2022-07-23 DIAGNOSIS — R972 Elevated prostate specific antigen [PSA]: Secondary | ICD-10-CM

## 2022-07-23 LAB — BMP8+EGFR
BUN/Creatinine Ratio: 7 — ABNORMAL LOW (ref 10–24)
BUN: 9 mg/dL (ref 8–27)
CO2: 24 mmol/L (ref 20–29)
Calcium: 9.6 mg/dL (ref 8.6–10.2)
Chloride: 101 mmol/L (ref 96–106)
Creatinine, Ser: 1.32 mg/dL — ABNORMAL HIGH (ref 0.76–1.27)
Glucose: 87 mg/dL (ref 70–99)
Potassium: 4 mmol/L (ref 3.5–5.2)
Sodium: 139 mmol/L (ref 134–144)
eGFR: 61 mL/min/{1.73_m2} (ref >59)

## 2022-07-23 LAB — PSA: Prostate Specific Ag, Serum: 5.5 ng/mL — ABNORMAL HIGH (ref 0.0–4.0)

## 2022-07-27 IMAGING — CR DG ABDOMEN 1V
1 series · 1 of 1 positions shown · non-contrast
Comparison: 10/13/2016

CLINICAL DATA: Abdominal pain.Decreased bowel sounds. Patient feels
gassy.

EXAM:
ABDOMEN - 1 VIEW

[w abdomen upright]
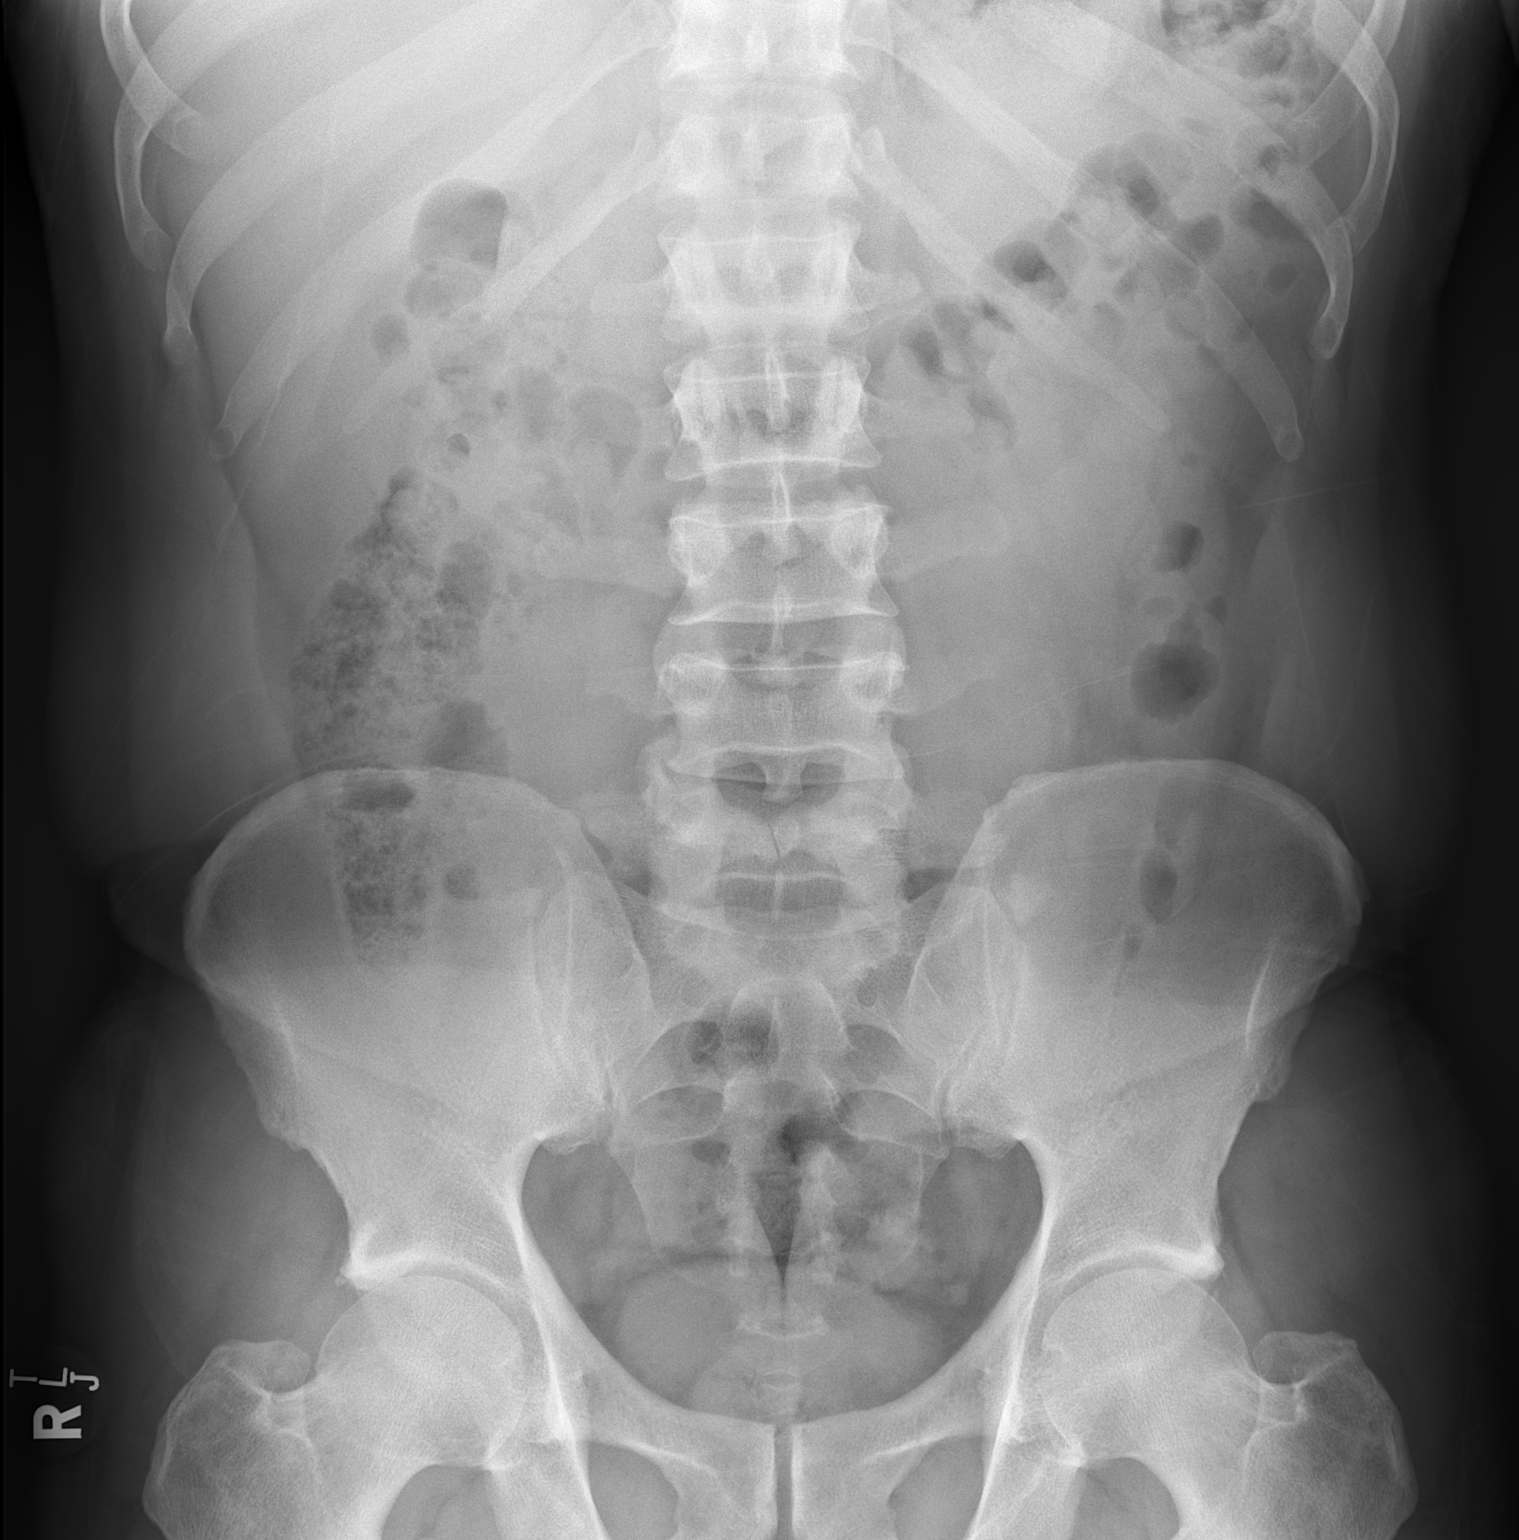

[1 of 1 positions shown; findings below may reference images not displayed]

FINDINGS: The bowel gas pattern is normal. No radio-opaque calculi or other
significant radiographic abnormality are seen.
IMPRESSION: Negative.

## 2022-10-15 ENCOUNTER — Encounter: Payer: Self-pay | Admitting: Nurse Practitioner

## 2022-10-15 ENCOUNTER — Ambulatory Visit (INDEPENDENT_AMBULATORY_CARE_PROVIDER_SITE_OTHER): Payer: 59 | Admitting: Nurse Practitioner

## 2022-10-15 VITALS — BP 130/80 | HR 82 | Temp 98.6°F | Ht 68.0 in | Wt 183.8 lb

## 2022-10-15 DIAGNOSIS — I10 Essential (primary) hypertension: Secondary | ICD-10-CM | POA: Diagnosis not present

## 2022-10-15 DIAGNOSIS — R972 Elevated prostate specific antigen [PSA]: Secondary | ICD-10-CM | POA: Diagnosis not present

## 2022-10-15 DIAGNOSIS — Q383 Other congenital malformations of tongue: Secondary | ICD-10-CM

## 2022-10-15 DIAGNOSIS — Z2821 Immunization not carried out because of patient refusal: Secondary | ICD-10-CM

## 2022-10-15 DIAGNOSIS — E559 Vitamin D deficiency, unspecified: Secondary | ICD-10-CM | POA: Diagnosis not present

## 2022-10-15 DIAGNOSIS — Z0000 Encounter for general adult medical examination without abnormal findings: Secondary | ICD-10-CM | POA: Diagnosis not present

## 2022-10-15 DIAGNOSIS — Z79899 Other long term (current) drug therapy: Secondary | ICD-10-CM

## 2022-10-15 DIAGNOSIS — E7801 Familial hypercholesterolemia: Secondary | ICD-10-CM

## 2022-10-15 LAB — POCT URINALYSIS DIPSTICK
Bilirubin, UA: NEGATIVE
Blood, UA: NEGATIVE
Glucose, UA: NEGATIVE
Ketones, UA: NEGATIVE
Leukocytes, UA: NEGATIVE
Nitrite, UA: NEGATIVE
Protein, UA: NEGATIVE
Spec Grav, UA: 1.025 (ref 1.010–1.025)
Urobilinogen, UA: 0.2 E.U./dL
pH, UA: 6 (ref 5.0–8.0)

## 2022-10-15 MED ORDER — AMOXICILLIN 875 MG PO TABS: 875.0000 mg | ORAL_TABLET | Freq: Two times a day (BID) | ORAL | 0 refills | Status: DC

## 2022-10-15 NOTE — Patient Instructions (Addendum)
Health Maintenance, Male Adopting a healthy lifestyle and getting preventive care are important in promoting health and wellness. Ask your health care provider about: The right schedule for you to have regular tests and exams. Things you can do on your own to prevent diseases and keep yourself healthy. What should I know about diet, weight, and exercise? Eat a healthy diet  Eat a diet that includes plenty of vegetables, fruits, low-fat dairy products, and lean protein. Do not eat a lot of foods that are high in solid fats, added sugars, or sodium. Maintain a healthy weight Body mass index (BMI) is a measurement that can be used to identify possible weight problems. It estimates body fat based on height and weight. Your health care provider can help determine your BMI and help you achieve or maintain a healthy weight. Get regular exercise Get regular exercise. This is one of the most important things you can do for your health. Most adults should: Exercise for at least 150 minutes each week. The exercise should increase your heart rate and make you sweat (moderate-intensity exercise). Do strengthening exercises at least twice a week. This is in addition to the moderate-intensity exercise. Spend less time sitting. Even light physical activity can be beneficial. Watch cholesterol and blood lipids Have your blood tested for lipids and cholesterol at 64 years of age, then have this test every 5 years. You may need to have your cholesterol levels checked more often if: Your lipid or cholesterol levels are high. You are older than 64 years of age. You are at high risk for heart disease. What should I know about cancer screening? Many types of cancers can be detected early and may often be prevented. Depending on your health history and family history, you may need to have cancer screening at various ages. This may include screening for: Colorectal cancer. Prostate cancer. Skin cancer. Lung  cancer. What should I know about heart disease, diabetes, and high blood pressure? Blood pressure and heart disease High blood pressure causes heart disease and increases the risk of stroke. This is more likely to develop in people who have high blood pressure readings or are overweight. Talk with your health care provider about your target blood pressure readings. Have your blood pressure checked: Every 3-5 years if you are 18-39 years of age. Every year if you are 40 years old or older. If you are between the ages of 65 and 75 and are a current or former smoker, ask your health care provider if you should have a one-time screening for abdominal aortic aneurysm (AAA). Diabetes Have regular diabetes screenings. This checks your fasting blood sugar level. Have the screening done: Once every three years after age 45 if you are at a normal weight and have a low risk for diabetes. More often and at a younger age if you are overweight or have a high risk for diabetes. What should I know about preventing infection? Hepatitis B If you have a higher risk for hepatitis B, you should be screened for this virus. Talk with your health care provider to find out if you are at risk for hepatitis B infection. Hepatitis C Blood testing is recommended for: Everyone born from 1945 through 1965. Anyone with known risk factors for hepatitis C. Sexually transmitted infections (STIs) You should be screened each year for STIs, including gonorrhea and chlamydia, if: You are sexually active and are younger than 64 years of age. You are older than 64 years of age and your   health care provider tells you that you are at risk for this type of infection. Your sexual activity has changed since you were last screened, and you are at increased risk for chlamydia or gonorrhea. Ask your health care provider if you are at risk. Ask your health care provider about whether you are at high risk for HIV. Your health care provider  may recommend a prescription medicine to help prevent HIV infection. If you choose to take medicine to prevent HIV, you should first get tested for HIV. You should then be tested every 3 months for as long as you are taking the medicine. Follow these instructions at home: Alcohol use Do not drink alcohol if your health care provider tells you not to drink. If you drink alcohol: Limit how much you have to 0-2 drinks a day. Know how much alcohol is in your drink. In the [place] one drink equals one 12 oz bottle of beer (355 mL), one 5 oz glass of wine (148 mL), or one 1 oz glass of hard liquor (44 mL). Lifestyle Do not use any products that contain nicotine or tobacco. These products include cigarettes, chewing tobacco, and vaping devices, such as e-cigarettes. If you need help quitting, ask your health care provider. Do not use street drugs. Do not share needles. Ask your health care provider for help if you need support or information about quitting drugs. General instructions Schedule regular health, dental, and eye exams. Stay current with your vaccines. Tell your health care provider if: You often feel depressed. You have ever been abused or do not feel safe at home. Summary Adopting a healthy lifestyle and getting preventive care are important in promoting health and wellness. Follow your health care provider's instructions about healthy diet, exercising, and getting tested or screened for diseases. Follow your health care provider's instructions on monitoring your cholesterol and blood pressure. This information is not intended to replace advice given to you by your health care provider. Make sure you discuss any questions you have with your health care provider. Document Revised: 05/06/2021 Document Reviewed: 05/06/2021 Elsevier Patient Education  [place].   Call Urology for an appt at [facility]  [phone]

## 2022-10-15 NOTE — Progress Notes (Signed)
Barnet Glasgow Martin,acting as a Education administrator for [name], FNP.,have documented all relevant documentation on the behalf of [name], FNP,as directed by  [name], FNP while in the presence of [name], [place].   Subjective:     Patient ID: Larry Martin , male    DOB: 08/15/1956 , 66 y.o.   MRN: [id number]   Chief Complaint  Patient presents with   Annual Exam    HPI  Patient presents today for HM. Patient states compliance with medications. Patient states his shingrix shot made him sick. Patient has small white bump on the left side of his tongue, he states it isnt preventing eating or talking but when his teeth touch it it sometimes hurts. Bump has been presents for about a week. He has been to the dentist last week. He may have an infection on his gums, next appt is in October.   BP Readings from Last 3 Encounters: 10/15/22 : 130/80 07/22/22 : 120/80 04/15/22 : 130/82  He is retired from [employer] last year after working for 24 years.   Wt Readings from Last 3 Encounters: 10/15/22 : 183 lb 12.8 oz (83.4 kg) 07/22/22 : 185 lb 3.2 oz (84 kg) 04/15/22 : 185 lb 6.4 oz (84.1 kg)         Past Medical History:  Diagnosis Date   H. pylori infection    HLD (hyperlipidemia)      Family History  Problem Relation Age of Onset   Blindness Mother        not leagally blind   Colon cancer Neg Hx    Stomach cancer Neg Hx    Esophageal cancer Neg Hx    Inflammatory bowel disease Neg Hx    Liver disease Neg Hx    Pancreatic cancer Neg Hx    Rectal cancer Neg Hx      Current Outpatient Medications:    amLODipine (NORVASC) 5 MG tablet, Take 1 tablet (5 mg total) by mouth daily., Disp: 90 tablet, Rfl: 1   amoxicillin (AMOXIL) 875 MG tablet, Take 1 tablet (875 mg total) by mouth 2 (two) times daily., Disp: 14 tablet, Rfl: 0   atorvastatin (LIPITOR) 10 MG tablet, Take 1 tablet (10 mg total) by mouth daily., Disp: 90 tablet, Rfl: 1   Magnesium 250 MG TABS, Take 1 tablet  (250 mg total) by mouth every evening. With evening meal, Disp: 90 tablet, Rfl: 0   omeprazole (PRILOSEC) 40 MG capsule, Take 1 capsule (40 mg total) by mouth 2 (two) times daily., Disp: 90 capsule, Rfl: 1   Vitamin D, Ergocalciferol, (DRISDOL) 1.25 MG (50000 UNIT) CAPS capsule, Take 1 capsule (50,000 Units total) by mouth every 7 (seven) days., Disp: 12 capsule, Rfl: 1   No Known Allergies   Men's preventive visit. Patient Health Questionnaire (PHQ-2) is  [place] Office Visit from 10/15/2022 in [facility]  PHQ-2 Total Score 0     Patient is on a Regular diet. Exercising with walking about 30 minutes or more. Marital status: Married. Relevant history for alcohol use is:  Social History   Substance and Sexual Activity  Alcohol Use No   Alcohol/week: 0.0 standard drinks of alcohol   Relevant history for tobacco use is:  Social History   Tobacco Use  Smoking Status Every Day   Packs/day: 0.30   Years: 35.00   Total pack years: 10.50   Types: Cigarettes  Smokeless Tobacco Never  Tobacco Comments   Smokes 1-3 cigarettes  a day, 10-18 when he eats fish. Smokes 3 cigarettes.     Review of Systems  Constitutional: Negative.   HENT: Negative.    Eyes: Negative.   Respiratory: Negative.    Cardiovascular: Negative.   Gastrointestinal: Negative.  Negative for abdominal pain, diarrhea, nausea and vomiting.  Endocrine: Negative.   Genitourinary: Negative.   Musculoskeletal: Negative.   Skin: Negative.   Allergic/Immunologic: Negative.   Neurological: Negative.   Hematological: Negative.   Psychiatric/Behavioral: Negative.       Today's Vitals   10/15/22 1118  BP: 130/80  Pulse: 82  Temp: 98.6 F (37 C)  TempSrc: Oral  Weight: 183 lb 12.8 oz (83.4 kg)  Height: 5' 8" (1.727 m)  PainSc: 0-No pain   Body mass index is 27.95 kg/m.  Wt Readings from Last 3 Encounters:  10/15/22 183 lb 12.8 oz (83.4 kg)  07/22/22 185 lb 3.2 oz (84 kg)   04/15/22 185 lb 6.4 oz (84.1 kg)    Objective:  Physical Exam Vitals reviewed.  Constitutional:      General: He is not in acute distress.    Appearance: Normal appearance.  HENT:     Head: Normocephalic and atraumatic.     Right Ear: Tympanic membrane, ear canal and external ear normal. There is no impacted cerumen.     Left Ear: Tympanic membrane, ear canal and external ear normal. There is no impacted cerumen.     Nose:     Comments: Deferred - masked    Mouth/Throat:      Comments: Left tongue has a white area pimple present Eyes:     Extraocular Movements: Extraocular movements intact.     Conjunctiva/sclera: Conjunctivae normal.     Pupils: Pupils are equal, round, and reactive to light.  Cardiovascular:     Rate and Rhythm: Normal rate and regular rhythm.     Pulses: Normal pulses.     Heart sounds: Normal heart sounds. No murmur heard. Pulmonary:     Effort: Pulmonary effort is normal. No respiratory distress.     Breath sounds: Normal breath sounds. No wheezing.  Abdominal:     General: Abdomen is flat. Bowel sounds are normal. There is no distension.     Palpations: Abdomen is soft.     Tenderness: There is no abdominal tenderness.  Genitourinary:    Prostate: Normal.     Rectum: Guaiac result negative.  Musculoskeletal:        General: Normal range of motion.     Cervical back: Normal range of motion and neck supple. No tenderness.  Skin:    General: Skin is warm and dry.     Capillary Refill: Capillary refill takes less than 2 seconds.  Neurological:     General: No focal deficit present.     Mental Status: He is alert and oriented to person, place, and time.     Cranial Nerves: No cranial nerve deficit.     Motor: No weakness.  Psychiatric:        Mood and Affect: Mood normal.        Behavior: Behavior normal.        Thought Content: Thought content normal.        Judgment: Judgment normal.         Assessment And Plan:    1. Annual physical  exam Behavior modifications discussed and diet history reviewed.   Pt will continue to exercise regularly and modify diet with low GI, plant based foods and  decrease intake of processed foods.  Recommend intake of daily multivitamin, Vitamin D, and calcium.  Recommend colonoscopy for preventive screenings, as well as recommend immunizations that include influenza, TDAP, and Shingles (declined)  2. Primary hypertension Comments: Blood pressure is slightly fairly controlled. He has not taken his medications today, advised to take regularly. - EKG 12-Lead - Microalbumin / Creatinine Urine Ratio - POCT Urinalysis Dipstick (81002) - Hemoglobin A1c - CMP14+EGFR  3. Elevated PSA Comments: Levels have decreased however are still elevated, I have already referred to Urology, phone number given to [facility]  4. Vitamin D deficiency Will check vitamin D level and supplement as needed.    Also encouraged to spend 15 minutes in the sun daily.  - Vitamin D (25 hydroxy)  5. Tongue abnormality Comments: White papule to left side of tongue, tender with touch. Will treat with amoxicillin. DDx - papulits vs canker sore - amoxicillin (AMOXIL) 875 MG tablet; Take 1 tablet (875 mg total) by mouth 2 (two) times daily.  Dispense: 14 tablet; Refill: 0  6. Familial hypercholesterolemia Continue statin, tolerating well - Lipid panel  7. Herpes zoster vaccination declined Comments: he reports getting sick with his last shingrix vaccine   8. Other long term (current) drug therapy - CBC no Diff   Patient was given opportunity to ask questions. Patient verbalized understanding of the plan and was able to repeat key elements of the plan. All questions were answered to their satisfaction.   [name], FNP   I, [name], FNP, have reviewed all documentation for this visit. The documentation on 10/27/22 for the exam, diagnosis, procedures, and orders are all accurate and complete.   THE  PATIENT IS ENCOURAGED TO PRACTICE SOCIAL DISTANCING DUE TO THE COVID-19 PANDEMIC.

## 2022-10-16 LAB — CMP14+EGFR
ALT: 21 IU/L (ref 0–44)
AST: 16 IU/L (ref 0–40)
Albumin/Globulin Ratio: 1.7 (ref 1.2–2.2)
Albumin: 4.6 g/dL (ref 3.9–4.9)
Alkaline Phosphatase: 36 IU/L — ABNORMAL LOW (ref 44–121)
BUN/Creatinine Ratio: 8 — ABNORMAL LOW (ref 10–24)
BUN: 12 mg/dL (ref 8–27)
Bilirubin Total: 0.5 mg/dL (ref 0.0–1.2)
CO2: 24 mmol/L (ref 20–29)
Calcium: 10 mg/dL (ref 8.6–10.2)
Chloride: 103 mmol/L (ref 96–106)
Creatinine, Ser: 1.43 mg/dL — ABNORMAL HIGH (ref 0.76–1.27)
Globulin, Total: 2.7 g/dL (ref 1.5–4.5)
Glucose: 87 mg/dL (ref 70–99)
Potassium: 4.2 mmol/L (ref 3.5–5.2)
Sodium: 142 mmol/L (ref 134–144)
Total Protein: 7.3 g/dL (ref 6.0–8.5)
eGFR: 55 mL/min/{1.73_m2} — ABNORMAL LOW (ref >59)

## 2022-10-16 LAB — CBC
Hematocrit: 50.4 % (ref 37.5–51.0)
Hemoglobin: 15.4 g/dL (ref 13.0–17.7)
MCH: 23.8 pg — ABNORMAL LOW (ref 26.6–33.0)
MCHC: 30.6 g/dL — ABNORMAL LOW (ref 31.5–35.7)
MCV: 78 fL — ABNORMAL LOW (ref 79–97)
Platelets: 215 10*3/uL (ref 150–450)
RBC: 6.48 x10E6/uL — ABNORMAL HIGH (ref 4.14–5.80)
RDW: 14.6 % (ref 11.6–15.4)
WBC: 4.9 10*3/uL (ref 3.4–10.8)

## 2022-10-16 LAB — MICROALBUMIN / CREATININE URINE RATIO
Creatinine, Urine: 228.6 mg/dL
Microalb/Creat Ratio: 7 mg/g creat (ref 0–29)
Microalbumin, Urine: 15.4 ug/mL

## 2022-10-16 LAB — LIPID PANEL
Chol/HDL Ratio: 4.3 ratio (ref 0.0–5.0)
Cholesterol, Total: 198 mg/dL (ref 100–199)
HDL: 46 mg/dL (ref >39)
LDL Chol Calc (NIH): 130 mg/dL — ABNORMAL HIGH (ref 0–99)
Triglycerides: 121 mg/dL (ref 0–149)
VLDL Cholesterol Cal: 22 mg/dL (ref 5–40)

## 2022-10-16 LAB — VITAMIN D 25 HYDROXY (VIT D DEFICIENCY, FRACTURES): Vit D, 25-Hydroxy: 29.7 ng/mL — ABNORMAL LOW (ref 30.0–100.0)

## 2022-10-16 LAB — HEMOGLOBIN A1C
Est. average glucose Bld gHb Est-mCnc: 117 mg/dL
Hgb A1c MFr Bld: 5.7 % — ABNORMAL HIGH (ref 4.8–5.6)

## 2022-12-30 ENCOUNTER — Other Ambulatory Visit: Payer: Self-pay | Admitting: Urology

## 2022-12-30 DIAGNOSIS — R972 Elevated prostate specific antigen [PSA]: Secondary | ICD-10-CM

## 2023-01-18 ENCOUNTER — Ambulatory Visit
Admission: RE | Admit: 2023-01-18 | Discharge: 2023-01-18 | Disposition: A | Discharge status: Home / Self Care | Payer: 59 | Source: Ambulatory Visit | Attending: Urology | Admitting: Urology

## 2023-01-18 DIAGNOSIS — R972 Elevated prostate specific antigen [PSA]: Secondary | ICD-10-CM

## 2023-01-18 MED ORDER — GADOPICLENOL 0.5 MMOL/ML IV SOLN
7.5000 mL | Freq: Once | INTRAVENOUS | Status: AC | PRN
  Administered 2023-01-18: 7.5 mL via INTRAVENOUS

## 2023-01-29 DIAGNOSIS — C61 Malignant neoplasm of prostate: Secondary | ICD-10-CM

## 2023-01-29 HISTORY — DX: Malignant neoplasm of prostate: C61

## 2023-04-09 NOTE — Progress Notes (Signed)
GU Location of Tumor / Histology: Prostate Ca  If Prostate Cancer, Gleason Score is (4 + 3) and PSA is (5.31 on 11/2022)  Biopsies      01/18/2023 Dr. Bjorn Pippin MR Prostate with/without Contrast CLINICAL DATA: Elevated PSA of 5.31. R97.20   FINDINGS: Prostate:   Region of interest # 1: PI-RADS category 5 lesion of the left anterior peripheral zone in the mid gland and apex, with focally reduced T2 signal (image 46, series 10) associated with reduced ADC map and restricted diffusion (image 15 of series 12 and 13) as well as focal early enhancement (image 167 of series 16). Right this measures 0.58 cc (1.7 by 0.8 by 0.8 cm).   Encapsulated nodularity in the transition zone compatible with benign prostatic hypertrophy.   Volume: 3D volumetric analysis: Prostate volume 66.89 cc (5.6 by 4.8 by 5.3 cm).   Transcapsular spread:  Absent   Seminal vesicle involvement: Absent   Neurovascular bundle involvement: Absent   Pelvic adenopathy: Absent   Bone metastasis: Absent   Other findings: Sigmoid colon diverticulosis   IMPRESSION: 1. PI-RADS category 5 lesion of the left anterior peripheral zone.  Targeting data sent to [facility]. 2. Prostatomegaly and benign prostatic hypertrophy. 3. Sigmoid colon diverticulosis.  Past/Anticipated interventions by urology, if any:     Past/Anticipated interventions by medical oncology, if any: NA  Weight changes, if any:  No  IPSS:  1 SHIM:  25  Bowel/Bladder complaints, if any:  No  Nausea/Vomiting, if any: No  Pain issues, if any:  0/10  SAFETY ISSUES: Prior radiation?  No Pacemaker/ICD? No Possible current pregnancy? Male Is the patient on methotrexate? No  Current Complaints / other details:

## 2023-04-14 ENCOUNTER — Encounter: Payer: Self-pay | Admitting: Radiation Oncology

## 2023-04-14 ENCOUNTER — Ambulatory Visit
Admission: RE | Admit: 2023-04-14 | Discharge: 2023-04-14 | Disposition: A | Discharge status: Home / Self Care | Payer: 59 | Source: Ambulatory Visit | Attending: Radiation Oncology | Admitting: Radiation Oncology

## 2023-04-14 ENCOUNTER — Encounter: Payer: Self-pay | Admitting: Urology

## 2023-04-14 VITALS — BP 179/88 | HR 94 | Temp 98.0°F | Resp 20 | Wt 186.2 lb

## 2023-04-14 DIAGNOSIS — Z79899 Other long term (current) drug therapy: Secondary | ICD-10-CM | POA: Insufficient documentation

## 2023-04-14 DIAGNOSIS — E785 Hyperlipidemia, unspecified: Secondary | ICD-10-CM | POA: Insufficient documentation

## 2023-04-14 DIAGNOSIS — C61 Malignant neoplasm of prostate: Secondary | ICD-10-CM

## 2023-04-14 DIAGNOSIS — I10 Essential (primary) hypertension: Secondary | ICD-10-CM | POA: Diagnosis not present

## 2023-04-14 DIAGNOSIS — Z803 Family history of malignant neoplasm of breast: Secondary | ICD-10-CM | POA: Insufficient documentation

## 2023-04-14 DIAGNOSIS — F17210 Nicotine dependence, cigarettes, uncomplicated: Secondary | ICD-10-CM | POA: Insufficient documentation

## 2023-04-14 DIAGNOSIS — K219 Gastro-esophageal reflux disease without esophagitis: Secondary | ICD-10-CM | POA: Insufficient documentation

## 2023-04-14 HISTORY — DX: Benign prostatic hyperplasia with lower urinary tract symptoms: N13.8

## 2023-04-14 HISTORY — DX: Other obstructive and reflux uropathy: N40.1

## 2023-04-14 HISTORY — DX: Elevated prostate specific antigen (PSA): R97.20

## 2023-04-14 HISTORY — DX: Essential (primary) hypertension: I10

## 2023-04-14 HISTORY — DX: Poor urinary stream: R39.12

## 2023-04-14 HISTORY — DX: Gastro-esophageal reflux disease without esophagitis: K21.9

## 2023-04-14 NOTE — Progress Notes (Signed)
Radiation Oncology         [phone] ________________________________  Initial Outpatient Consultation  Name: Larry Martin MRN: [id number]  Date: 04/14/2023  DOB: Jun 25, 1956  CC:Arnette Felts, FNP  Bjorn Pippin, MD   REFERRING PHYSICIAN: Bjorn Pippin, MD  DIAGNOSIS: 67 y.o. gentleman with Stage T1c adenocarcinoma of the prostate with Gleason score of 4+3, and PSA of 5.31.    ICD-10-CM   1. Malignant neoplasm of prostate  C61       HISTORY OF PRESENT ILLNESS: Larry Martin is a 67 y.o. male with a diagnosis of prostate cancer. He is an established urology patient, previously followed by Dr. Annabell Howells for a spermatocele in 2017. More recently, he was referred back to Dr. Annabell Howells for evaluation of an elevated PSA of 5.5 on routine labs with his primary care physician, Dr. Allyne Gee. Of note, he had a previously elevated PSA at 14.9 in 09/2021 secondary to an E.coli UTI and the PSA decreased to 4.9 on repeat labs 10/18/21, after completing antibiotics. He met back with Dr. Annabell Howells on 12/08/22,  digital rectal examination performed at that time showed no abnormalities within limitation of prostate being high-riding. A repeat PSA obtained that day showed persistent elevation at 5.31. He underwent prostate MRI on 01/18/23 showing a PI-RADS 5 lesion in the left anterior peripheral zone. The patient proceeded to MRI fusion biopsy of the prostate on 02/25/23.  The prostate volume measured 58 cc.  Out of 15 core biopsies, 4 were positive.  The maximum Gleason score was 4+3, and this was seen in 2 of 3 samples from the MRI ROI as well as the left mid lateral, and left apex (small focus).  The patient reviewed the biopsy results with his urologist and he has kindly been referred today for discussion of potential radiation treatment options. His wife, Selena Batten, is accompanying him today for this visit.   PREVIOUS RADIATION THERAPY: No  PAST MEDICAL HISTORY:  Past Medical History:  Diagnosis Date   BPH with  obstruction/lower urinary tract symptoms    Elevated PSA    GERD (gastroesophageal reflux disease)    H. 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 Smokes 3 cigarettes.   Vaping Use   Vaping Use: Never used  Substance and Sexual Activity   Alcohol use: No    Alcohol/week: 0.0 standard drinks of alcohol   Drug use: Never   Sexual activity: Never    Partners: Female  Other Topics Concern   Not on file  Social History Narrative   Not on file   Social Determinants of Health   Financial Resource Strain: Not on file  Food Insecurity: No Food Insecurity (04/14/2023)   Hunger Vital Sign    Worried About Running Out of Food in the Last Year: Never true    Ran Out of Food in the Last Year: Never true  Transportation Needs: No Transportation Needs (04/14/2023)   PRAPARE - Scientist, research (physical sciences) (Medical): No    Lack of Transportation (Non-Medical): No  Physical Activity: Not on file  Stress: Not on file  Social Connections: Not on file  Intimate Partner Violence: Not At Risk (04/14/2023)   Humiliation, Afraid, Rape, and Kick questionnaire    Fear of Current or Ex-Partner: No    Emotionally Abused: No    Physically Abused: No    Sexually Abused: No    ALLERGIES: Patient has no known allergies.  MEDICATIONS:  Current Outpatient Medications  Medication Sig Dispense Refill   amLODipine (NORVASC) 5 MG tablet Take 1 tablet (5 mg total) by mouth daily. 90 tablet 1   atorvastatin (LIPITOR) 10 MG tablet Take 1 tablet (10 mg total) by mouth daily. 90 tablet 1   omeprazole (PRILOSEC) 40 MG capsule Take 1 capsule (40 mg total) by mouth 2 (two) times daily. 90 capsule 1   No current facility-administered medications for this encounter.    REVIEW OF SYSTEMS:  On review of systems, the patient reports that he is doing well overall. He denies any chest pain, shortness of breath, cough, fevers, chills, night sweats, unintended weight changes. He denies any bowel disturbances, and denies abdominal pain, nausea or vomiting. He denies any new musculoskeletal or joint aches or pains. His IPSS was 1, indicating mild urinary symptoms. His SHIM was 25, indicating he does not have erectile dysfunction. A complete review of systems is obtained and is otherwise negative.    PHYSICAL EXAM:  Wt Readings from Last 3 Encounters:  04/14/23 186 lb 3.2 oz (84.5 kg)  10/15/22 183 lb 12.8 oz (83.4 kg)  07/22/22 185 lb 3.2 oz (84 kg)   Temp Readings from Last 3 Encounters:  04/14/23 98 F (36.7 C)  10/15/22 98.6 F (37 C) (Oral)  07/22/22 98.1 F (36.7 C)   BP Readings from Last 3 Encounters:  04/14/23 (!) 179/88  10/15/22 130/80  07/22/22 120/80   Pulse Readings from Last 3 Encounters:  04/14/23 94  10/15/22 82  07/22/22 90   Pain Assessment Pain Score: 0-No pain/10  In  general this is a well appearing African male in no acute distress. He's alert and oriented x4 and appropriate throughout the examination. Cardiopulmonary assessment is negative for acute distress, and he exhibits normal effort.     KPS = 100  100 - Normal; no complaints; no evidence of disease. 90   - Able to carry on normal activity; minor signs or symptoms of disease. 80   - Normal activity with effort; some signs or symptoms of disease. 70   -  Cares for self; unable to carry on normal activity or to do active work. 60   - Requires occasional assistance, but is able to care for most of his personal needs. 50   - Requires considerable assistance and frequent medical care. 40   - Disabled; requires special care and assistance. 30   - Severely disabled; hospital admission is indicated although death not imminent. 20   - Very sick; hospital admission necessary; active supportive treatment necessary. 10   - Moribund; fatal processes progressing rapidly. 0     - Dead  Karnofsky DA, Abelmann WH, Craver LS and Burchenal [facility] [phone]) The use of the nitrogen mustards in the palliative treatment of carcinoma: with particular reference to bronchogenic carcinoma Cancer 1 634-56  LABORATORY DATA:  Lab Results  Component Value Date   WBC 4.9 10/15/2022   HGB 15.4 10/15/2022   HCT 50.4 10/15/2022   MCV 78 (L) 10/15/2022   PLT 215 10/15/2022   Lab Results  Component Value Date   NA 142 10/15/2022   K 4.2 10/15/2022   CL 103 10/15/2022   CO2 24 10/15/2022   Lab Results  Component Value Date   ALT 21 10/15/2022   AST 16 10/15/2022   ALKPHOS 36 (L) 10/15/2022   BILITOT 0.5 10/15/2022     RADIOGRAPHY: No results found.    IMPRESSION/PLAN: 1. 67 y.o. gentleman with Stage T1c adenocarcinoma of the prostate with Gleason Score of 4+3, and PSA of 5.31. We discussed the patient's workup and outlined the nature of prostate cancer in this setting. The patient's T stage, Gleason's score, and PSA put  him into the unfavorable intermediate risk group. Accordingly, he is eligible for a variety of potential treatment options including brachytherapy, 5.5 weeks of external radiation, or prostatectomy. We discussed the available radiation techniques, and focused on the details and logistics of delivery. We discussed and outlined the risks, benefits, short and long-term effects associated with radiotherapy and compared and contrasted these with prostatectomy. We discussed the role of SpaceOAR gel in reducing the rectal toxicity associated with radiotherapy. He was encouraged to ask questions that were answered to his stated satisfaction.  At the conclusion of our conversation, the patient is interested in moving forward with 5.5 weeks of external beam therapy.  We will share our discussion with Dr. Annabell Howells and make arrangements for fiducial markers and SpaceOAR gel placement, in late May 2024 when he returns from a planned vacation, prior to simulation, to reduce rectal toxicity from radiotherapy. The patient appears to have a good understanding of his disease and our treatment recommendations which are of curative intent and is in agreement with the stated plan.  Therefore, we will move forward with treatment planning accordingly, in anticipation of beginning IMRT in late May or early June 2024, when he returns from a planned vacation 05/15/23 - 05/22/23. We enjoyed meeting him and his wife today and look forward to continuing to participate in his care.  We personally spent 70 minutes in this encounter including chart review, reviewing radiological studies, meeting face-to-face with the patient, entering orders and completing documentation.    Marguarite Arbour, PA-C    Margaretmary Dys, MD  [facility]  Radiation Oncology Direct Dial: [phone]  Fax: [phone] [email or web address]  Skype  [email or web address]   This document serves as a record of services personally performed by Margaretmary Dys, MD and Marcello Fennel, PA-C. It was created on their behalf by Mickie Bail, a trained medical scribe. The creation of this  record is based on the scribe's personal observations and the provider's statements to them. This document has been checked and approved by the attending provider.

## 2023-04-16 ENCOUNTER — Ambulatory Visit: Payer: 59 | Admitting: Nurse Practitioner

## 2023-04-16 ENCOUNTER — Encounter: Payer: Self-pay | Admitting: Nurse Practitioner

## 2023-04-16 VITALS — BP 124/72 | HR 98 | Temp 98.8°F | Ht 68.0 in | Wt 185.2 lb

## 2023-04-16 DIAGNOSIS — E7801 Familial hypercholesterolemia: Secondary | ICD-10-CM

## 2023-04-16 DIAGNOSIS — I10 Essential (primary) hypertension: Secondary | ICD-10-CM | POA: Insufficient documentation

## 2023-04-16 DIAGNOSIS — C61 Malignant neoplasm of prostate: Secondary | ICD-10-CM

## 2023-04-16 DIAGNOSIS — E559 Vitamin D deficiency, unspecified: Secondary | ICD-10-CM

## 2023-04-16 DIAGNOSIS — R7309 Other abnormal glucose: Secondary | ICD-10-CM

## 2023-04-16 MED ORDER — ATORVASTATIN CALCIUM 10 MG PO TABS: 10.0000 mg | ORAL_TABLET | Freq: Every day | ORAL | 1 refills | Status: DC

## 2023-04-16 MED ORDER — AMLODIPINE BESYLATE 5 MG PO TABS: 5.0000 mg | ORAL_TABLET | Freq: Every day | ORAL | 1 refills | Status: DC

## 2023-04-16 NOTE — Progress Notes (Signed)
I,Sheena H Holbrook,acting as a Neurosurgeon for Arnette Felts, FNP.,have documented all relevant documentation on the behalf of Arnette Felts, FNP,as directed by  Arnette Felts, FNP while in the presence of Arnette Felts, FNP.    Subjective:     Patient ID: Larry Martin , male    DOB: 06-02-1956 , 67 y.o.   MRN: [id number]   Chief Complaint  Patient presents with   Medical Management of Chronic Issues    HPI  Patient presents today for hypertension follow up.  Continues to take amlodipine and atorvastatin.   He has recently been diagnosed with prostate cancer. He is planning to have treatment. He is going on vacation May 17th for 7 days.   He does not smoke much mostly when drinking coffee  He retired from [employer] since 2021     Past Medical History:  Diagnosis Date   BPH with obstruction/lower urinary tract symptoms    Elevated PSA    GERD (gastroesophageal reflux disease)    H. pylori infection    HLD (hyperlipidemia)    Hypertension    Weak urine stream      Family History  Problem Relation Age of Onset   Blindness Mother        not leagally blind   Breast cancer Daughter    Colon cancer Neg Hx    Stomach cancer Neg Hx    Esophageal cancer Neg Hx    Inflammatory bowel disease Neg Hx    Liver disease Neg Hx    Pancreatic cancer Neg Hx    Rectal cancer Neg Hx      Current Outpatient Medications:    amLODipine (NORVASC) 5 MG tablet, Take 1 tablet (5 mg total) by mouth daily., Disp: 90 tablet, Rfl: 1   atorvastatin (LIPITOR) 10 MG tablet, Take 1 tablet (10 mg total) by mouth daily., Disp: 90 tablet, Rfl: 1   omeprazole (PRILOSEC) 40 MG capsule, Take 1 capsule (40 mg total) by mouth 2 (two) times daily., Disp: 90 capsule, Rfl: 1   No Known Allergies   Review of Systems  All other systems reviewed and are negative.    Today's Vitals   04/16/23 1125  BP: 124/72  Pulse: 98  Temp: 98.8 F (37.1 C)  TempSrc: Oral  SpO2: 97%  Weight: 185 lb 3.2 oz (84  kg)  Height: 5\' 8"  (1.727 m)   Body mass index is 28.16 kg/m.   Objective:  Physical Exam Vitals reviewed.  Constitutional:      General: He is not in acute distress.    Appearance: Normal appearance.  Cardiovascular:     Rate and Rhythm: Normal rate and regular rhythm.     Pulses: Normal pulses.     Heart sounds: Normal heart sounds. No murmur heard. Pulmonary:     Effort: Pulmonary effort is normal. No respiratory distress.     Breath sounds: Normal breath sounds. No wheezing.  Musculoskeletal:        General: No swelling or tenderness.  Skin:    General: Skin is warm and dry.     Capillary Refill: Capillary refill takes less than 2 seconds.  Neurological:     General: No focal deficit present.     Mental Status: He is alert and oriented to person, place, and time.     Cranial Nerves: No cranial nerve deficit.     Motor: No weakness.  Psychiatric:        Mood and Affect: Mood normal.  Behavior: Behavior normal.        Thought Content: Thought content normal.        Judgment: Judgment normal.         Assessment And Plan:     1. Primary hypertension Comments: Blood pressure is well-controlled.  Continue current medications. - BMP8+eGFR - amLODipine (NORVASC) 5 MG tablet; Take 1 tablet (5 mg total) by mouth daily.  Dispense: 90 tablet; Refill: 1  2. Abnormal glucose Comments: Diet controlled.  Continue focusing on low sugar intake and low-carb. - Hemoglobin A1c  3. Vitamin D deficiency Will check vitamin D level and supplement as needed.    Also encouraged to spend 15 minutes in the sun daily.  - VITAMIN D 25 Hydroxy (Vit-D Deficiency, Fractures)  4. Malignant neoplasm of prostate [facility]) Comments: New diagnosis and he continues to follow-up with urology.  To have treatment after returning from vacation in May.  5. Familial hypercholesterolemia - atorvastatin (LIPITOR) 10 MG tablet; Take 1 tablet (10 mg total) by mouth daily.  Dispense: 90 tablet; Refill:  1     Patient was given opportunity to ask questions. Patient verbalized understanding of the plan and was able to repeat key elements of the plan. All questions were answered to their satisfaction.  Arnette Felts, FNP   I, Arnette Felts, FNP, have reviewed all documentation for this visit. The documentation on 04/16/23 for the exam, diagnosis, procedures, and orders are all accurate and complete.   IF YOU HAVE BEEN REFERRED TO A SPECIALIST, IT MAY TAKE 1-2 WEEKS TO SCHEDULE/PROCESS THE REFERRAL. IF YOU HAVE NOT HEARD FROM US/SPECIALIST IN TWO WEEKS, PLEASE GIVE [place] A CALL AT [phone].   THE PATIENT IS ENCOURAGED TO PRACTICE SOCIAL DISTANCING DUE TO THE COVID-19 PANDEMIC.

## 2023-04-17 ENCOUNTER — Other Ambulatory Visit: Payer: Self-pay | Admitting: Urology

## 2023-04-20 ENCOUNTER — Telehealth: Payer: Self-pay | Admitting: *Deleted

## 2023-04-20 NOTE — Telephone Encounter (Signed)
CALLED PATIENT TO INFORM OF FID. MARKER AND SPACE OAR PLACEMENT ON 06-02-23, AND HIS SIM ON 06-04-23- ARRIVAL TIME- 12:45 PM @ [facility], INFORMED PATIENT TO ARRIVE WITH A FULL BLADDER, SPOKE WITH PATIENT'S WIFE- [name] AND SHE IS AWARE OF THESE APPTS. AND THE INSTRUCTIONS

## 2023-04-26 DIAGNOSIS — E559 Vitamin D deficiency, unspecified: Secondary | ICD-10-CM | POA: Insufficient documentation

## 2023-04-26 DIAGNOSIS — R7309 Other abnormal glucose: Secondary | ICD-10-CM | POA: Insufficient documentation

## 2023-05-11 NOTE — Progress Notes (Signed)
RN spoke with patient's wife to assess any needs or questions prior to CT Simulation and fiducial's.    RN answered questions regarding radiation, side effects, and post treatment PSA's.  No additional needs at this time.

## 2023-05-19 ENCOUNTER — Encounter (HOSPITAL_BASED_OUTPATIENT_CLINIC_OR_DEPARTMENT_OTHER): Payer: Self-pay | Admitting: Urology

## 2023-05-20 ENCOUNTER — Encounter (HOSPITAL_BASED_OUTPATIENT_CLINIC_OR_DEPARTMENT_OTHER): Payer: Self-pay | Admitting: Urology

## 2023-05-20 NOTE — Progress Notes (Addendum)
Spoke w/ via phone for pre-op interview--- pt and pt's wife Lab needs dos---- [employer] results------ current EKG in epic/ chart COVID test -----patient states asymptomatic no test needed Arrive at ------- 0600 on 06-02-2023 NPO after MN NO Solid Food.  Clear liquids from MN until--- 0500 Med rec completed Medications to take morning of surgery ----- norvasc, prilosec Diabetic medication ----- n/a Patient instructed no nail polish to be worn day of surgery Patient instructed to bring photo id and insurance card day of surgery Patient aware to have Driver (ride ) / caregiver    for 24 hours after surgery -- wife, kim Patient Special Instructions ----- will do one fleet enema night before surgery Pre-Op special Instructions ----- n/a Patient verbalized understanding of instructions that were given at this phone interview. Patient denies shortness of breath, chest pain, fever, cough at this phone interview.

## 2023-06-01 ENCOUNTER — Other Ambulatory Visit: Payer: Self-pay | Admitting: Urology

## 2023-06-02 ENCOUNTER — Other Ambulatory Visit: Payer: Self-pay

## 2023-06-02 ENCOUNTER — Encounter (HOSPITAL_BASED_OUTPATIENT_CLINIC_OR_DEPARTMENT_OTHER)
Admission: RE | Disposition: A | Discharge status: Home / Self Care | Payer: Self-pay | Source: Ambulatory Visit | Attending: Urology

## 2023-06-02 ENCOUNTER — Telehealth: Payer: Self-pay | Admitting: *Deleted

## 2023-06-02 ENCOUNTER — Ambulatory Visit (HOSPITAL_BASED_OUTPATIENT_CLINIC_OR_DEPARTMENT_OTHER): Payer: 59 | Admitting: Anesthesiology

## 2023-06-02 ENCOUNTER — Encounter (HOSPITAL_BASED_OUTPATIENT_CLINIC_OR_DEPARTMENT_OTHER): Payer: Self-pay | Admitting: Urology

## 2023-06-02 ENCOUNTER — Ambulatory Visit (HOSPITAL_BASED_OUTPATIENT_CLINIC_OR_DEPARTMENT_OTHER)
Admission: RE | Admit: 2023-06-02 | Discharge: 2023-06-02 | Disposition: A | Discharge status: Home / Self Care | Payer: 59 | Source: Ambulatory Visit | Attending: Urology | Admitting: Urology

## 2023-06-02 DIAGNOSIS — Z8619 Personal history of other infectious and parasitic diseases: Secondary | ICD-10-CM | POA: Insufficient documentation

## 2023-06-02 DIAGNOSIS — I10 Essential (primary) hypertension: Secondary | ICD-10-CM | POA: Diagnosis not present

## 2023-06-02 DIAGNOSIS — C61 Malignant neoplasm of prostate: Secondary | ICD-10-CM

## 2023-06-02 DIAGNOSIS — F17200 Nicotine dependence, unspecified, uncomplicated: Secondary | ICD-10-CM | POA: Diagnosis not present

## 2023-06-02 DIAGNOSIS — F17210 Nicotine dependence, cigarettes, uncomplicated: Secondary | ICD-10-CM

## 2023-06-02 DIAGNOSIS — Z803 Family history of malignant neoplasm of breast: Secondary | ICD-10-CM | POA: Diagnosis not present

## 2023-06-02 DIAGNOSIS — Z01818 Encounter for other preprocedural examination: Secondary | ICD-10-CM

## 2023-06-02 HISTORY — DX: Male erectile dysfunction, unspecified: N52.9

## 2023-06-02 HISTORY — DX: Chronic idiopathic constipation: K59.04

## 2023-06-02 HISTORY — PX: SPACE OAR INSTILLATION: SHX6769

## 2023-06-02 HISTORY — PX: GOLD SEED IMPLANT: SHX6343

## 2023-06-02 HISTORY — DX: Vitamin D deficiency, unspecified: E55.9

## 2023-06-02 HISTORY — DX: Iron deficiency: E61.1

## 2023-06-02 LAB — POCT I-STAT, CHEM 8
BUN: 9 mg/dL (ref 8–23)
Calcium, Ion: 1.25 mmol/L (ref 1.15–1.40)
Chloride: 104 mmol/L (ref 98–111)
Creatinine, Ser: 1.3 mg/dL — ABNORMAL HIGH (ref 0.61–1.24)
Glucose, Bld: 103 mg/dL — ABNORMAL HIGH (ref 70–99)
HCT: 47 % (ref 39.0–52.0)
Hemoglobin: 16 g/dL (ref 13.0–17.0)
Potassium: 4.1 mmol/L (ref 3.5–5.1)
Sodium: 143 mmol/L (ref 135–145)
TCO2: 25 mmol/L (ref 22–32)

## 2023-06-02 SURGERY — INSERTION, GOLD SEEDS
Anesthesia: Monitor Anesthesia Care | Site: Prostate

## 2023-06-02 MED ORDER — SODIUM CHLORIDE (PF) 0.9 % IJ SOLN
INTRAMUSCULAR | Status: DC | PRN
  Administered 2023-06-02: 10 mL

## 2023-06-02 MED ORDER — PROPOFOL 500 MG/50ML IV EMUL
INTRAVENOUS | Status: DC | PRN
  Administered 2023-06-02: 200 ug/kg/min via INTRAVENOUS

## 2023-06-02 MED ORDER — FENTANYL CITRATE (PF) 100 MCG/2ML IJ SOLN
INTRAMUSCULAR | Status: AC
  Filled 2023-06-02: qty 2

## 2023-06-02 MED ORDER — MIDAZOLAM HCL 2 MG/2ML IJ SOLN
INTRAMUSCULAR | Status: AC
  Filled 2023-06-02: qty 2

## 2023-06-02 MED ORDER — LACTATED RINGERS IV SOLN: INTRAVENOUS | Status: DC

## 2023-06-02 MED ORDER — PROPOFOL 500 MG/50ML IV EMUL
INTRAVENOUS | Status: AC
  Filled 2023-06-02: qty 50

## 2023-06-02 MED ORDER — FENTANYL CITRATE (PF) 100 MCG/2ML IJ SOLN
INTRAMUSCULAR | Status: DC | PRN
  Administered 2023-06-02: 50 ug via INTRAVENOUS

## 2023-06-02 MED ORDER — MIDAZOLAM HCL 5 MG/5ML IJ SOLN
INTRAMUSCULAR | Status: DC | PRN
  Administered 2023-06-02: 2 mg via INTRAVENOUS

## 2023-06-02 MED ORDER — FENTANYL CITRATE (PF) 100 MCG/2ML IJ SOLN: 25.0000 ug | INTRAMUSCULAR | Status: DC | PRN

## 2023-06-02 MED ORDER — CEFAZOLIN SODIUM-DEXTROSE 2-4 GM/100ML-% IV SOLN
2.0000 g | INTRAVENOUS | Status: AC
  Administered 2023-06-02: 2 g via INTRAVENOUS

## 2023-06-02 MED ORDER — ONDANSETRON HCL 4 MG/2ML IJ SOLN: 4.0000 mg | Freq: Four times a day (QID) | INTRAMUSCULAR | Status: DC | PRN

## 2023-06-02 MED ORDER — DEXMEDETOMIDINE HCL IN NACL 200 MCG/50ML IV SOLN
INTRAVENOUS | Status: DC | PRN
  Administered 2023-06-02 (×2): 4 ug via INTRAVENOUS

## 2023-06-02 MED ORDER — OXYCODONE HCL 5 MG/5ML PO SOLN: 5.0000 mg | Freq: Once | ORAL | Status: DC | PRN

## 2023-06-02 MED ORDER — OXYCODONE HCL 5 MG PO TABS: 5.0000 mg | ORAL_TABLET | Freq: Once | ORAL | Status: DC | PRN

## 2023-06-02 MED ORDER — LIDOCAINE HCL (PF) 1 % IJ SOLN
INTRAMUSCULAR | Status: DC | PRN
  Administered 2023-06-02: 10 mL

## 2023-06-02 MED ORDER — CEFAZOLIN SODIUM-DEXTROSE 2-4 GM/100ML-% IV SOLN
INTRAVENOUS | Status: AC
  Filled 2023-06-02: qty 100

## 2023-06-02 SURGICAL SUPPLY — 26 items
BLADE CLIPPER SENSICLIP SURGIC (BLADE) ×1 IMPLANT
CNTNR URN SCR LID CUP LEK RST (MISCELLANEOUS) ×1 IMPLANT
CONT SPEC 4OZ STRL OR WHT (MISCELLANEOUS) ×1
COVER BACK TABLE 60X90IN (DRAPES) ×1 IMPLANT
DRSG TEGADERM 4X4.75 (GAUZE/BANDAGES/DRESSINGS) ×1 IMPLANT
DRSG TEGADERM 8X12 (GAUZE/BANDAGES/DRESSINGS) ×1 IMPLANT
GAUZE SPONGE 4X4 12PLY STRL (GAUZE/BANDAGES/DRESSINGS) ×1 IMPLANT
GLOVE BIO SURGEON STRL SZ7.5 (GLOVE) ×1 IMPLANT
GLOVE BIO SURGEON STRL SZ8 (GLOVE) IMPLANT
GLOVE SURG ORTHO 8.5 STRL (GLOVE) ×1 IMPLANT
IMPL SPACEOAR VUE SYSTEM (Spacer) ×1 IMPLANT
IMPLANT SPACEOAR VUE SYSTEM (Spacer) ×1 IMPLANT
KIT TURNOVER CYSTO (KITS) ×1 IMPLANT
MARKER GOLD PRELOAD 1.2X3 (Urological Implant) ×1 IMPLANT
MARKER SKIN DUAL TIP RULER LAB (MISCELLANEOUS) ×1 IMPLANT
NDL SPNL 22GX3.5 QUINCKE BK (NEEDLE) ×1 IMPLANT
NEEDLE SPNL 22GX3.5 QUINCKE BK (NEEDLE) ×1 IMPLANT
SEED GOLD PRELOAD 1.2X3 (Urological Implant) ×1 IMPLANT
SHEATH ULTRASOUND LF (SHEATH) IMPLANT
SHEATH ULTRASOUND LTX NONSTRL (SHEATH) IMPLANT
SLEEVE SCD COMPRESS KNEE MED (STOCKING) ×1 IMPLANT
SURGILUBE 2OZ TUBE FLIPTOP (MISCELLANEOUS) ×1 IMPLANT
SYR 10ML LL (SYRINGE) IMPLANT
SYR CONTROL 10ML LL (SYRINGE) ×1 IMPLANT
TOWEL OR 17X24 6PK STRL BLUE (TOWEL DISPOSABLE) ×1 IMPLANT
UNDERPAD 30X36 HEAVY ABSORB (UNDERPADS AND DIAPERS) ×1 IMPLANT

## 2023-06-02 NOTE — Transfer of Care (Signed)
Immediate Anesthesia Transfer of Care Note  Patient: Larry Martin  Procedure(s) Performed: GOLD SEED IMPLANT (Prostate) SPACE OAR INSTILLATION (Prostate)  Patient Location: PACU  Anesthesia Type:MAC  Level of Consciousness: awake, alert , and oriented  Airway & Oxygen Therapy: Patient Spontanous Breathing and Patient connected to face mask oxygen  Post-op Assessment: Report given to RN and Post -op Vital signs reviewed and stable  Post vital signs: Reviewed and stable  Last Vitals:  Vitals Value Taken Time  BP 158/104 06/02/23 0856  Temp    Pulse 84 06/02/23 0857  Resp 16 06/02/23 0857  SpO2 100 % 06/02/23 0857  Vitals shown include unvalidated device data.  Last Pain:  Vitals:   06/02/23 0626  TempSrc: Oral  PainSc: 0-No pain      Patients Stated Pain Goal: 3 (06/02/23 5284)  Complications: No notable events documented.

## 2023-06-02 NOTE — Anesthesia Preprocedure Evaluation (Signed)
Anesthesia Evaluation  Patient identified by MRN, date of birth, ID band Patient awake    Reviewed: Allergy & Precautions, H&P , NPO status , Patient's Chart, lab work & pertinent test results  Airway Mallampati: II   Neck ROM: full    Dental   Pulmonary Current Smoker and Patient abstained from smoking.   breath sounds clear to auscultation       Cardiovascular hypertension,  Rhythm:regular Rate:Normal     Neuro/Psych    GI/Hepatic ,GERD  ,,  Endo/Other    Renal/GU    Prostate CA    Musculoskeletal   Abdominal   Peds  Hematology   Anesthesia Other Findings   Reproductive/Obstetrics                             Anesthesia Physical Anesthesia Plan  ASA: 2  Anesthesia Plan: MAC   Post-op Pain Management:    Induction: Intravenous  PONV Risk Score and Plan: 0 and Propofol infusion, Treatment may vary due to age or medical condition and Midazolam  Airway Management Planned: Simple Face Mask  Additional Equipment:   Intra-op Plan:   Post-operative Plan:   Informed Consent: I have reviewed the patients History and Physical, chart, labs and discussed the procedure including the risks, benefits and alternatives for the proposed anesthesia with the patient or authorized representative who has indicated his/her understanding and acceptance.     Dental advisory given  Plan Discussed with: CRNA, Anesthesiologist and Surgeon  Anesthesia Plan Comments:        Anesthesia Quick Evaluation

## 2023-06-02 NOTE — Discharge Instructions (Addendum)
Transrectal Ultrasound-Guided Prostate Gold Seed and Biodegradable Gel Placement, Care After  The following information offers guidance on how to care for yourself after your procedure. Your health care provider may also give you more specific instructions. If you have problems or questions, contact your health care provider. What can I expect after the procedure? After the procedure, it is common to have: Light bleeding from the rectum. Bruising or tenderness in the area behind the scrotum (perineum), if the needle was put into your prostate through this area. Small amounts of blood in your urine. This should only last for a few days. Light brown or red semen. This may last for a couple of weeks. Follow these instructions at home: Medicines A prescription pill bottle with an example of a pill.  Take over-the-counter and prescription medicines only as told by your health care provider. If you were prescribed an antibiotic, take it as told by your health care provider. Do not stop taking the antibiotic early, even if you start to feel better. Ask your health care provider if the medicine prescribed to you requires you to avoid driving or using machinery. Eating and drinking Follow instructions from your health care provider about eating or drinking restrictions. Drink enough fluid to keep your urine pale yellow. Managing pain and swelling If directed, put ice on the affected area. To do this: Put ice in a plastic bag. Place a towel between your skin and the bag. Leave the ice on for 20 minutes, 2-3 times a day. Be sure to remove the ice if your skin turns bright red. If you cannot feel pain, heat, or cold, you have a greater risk of damage to the area. Try not to sit directly on the area behind the scrotum. A soft cushion can help with discomfort. Activity If you were given a sedative during the procedure, it can affect you for several hours. Do not drive or operate machinery until your  health care provider says that it is safe. Return to your normal activities as told by your health care provider. Ask your health care provider what activities are safe for you. Follow instructions from your health care provider about when it is safe for you to engage in sexual activity. General instructions Plan to have a responsible adult care for you for the time you are told after you leave the hospital or clinic. Do not take baths, swim, or use a hot tub until your health care provider approves. Ask your health care provider if you may take showers. You may only be allowed to take sponge baths. Keep all follow-up visits. Contact a health care provider if: You have a fever or chills. You have more blood in your urine. You have blood in your urine for more than 2-3 days after the procedure. You have trouble passing urine or having a bowel movement. You have pain or burning when urinating. You have nausea or you vomit. Get help right away if: You have severe pain that does not get better with medicine. Your urine is bright red. You cannot urinate. You have rectal bleeding that gets worse. You have shortness of breath. Summary After the procedure, you may have blood in your urine and light bleeding from the rectum. Return to your normal activities as told by your health care provider. Ask your health care provider what activities are safe for you. Take over-the-counter and prescription medicines only as told by your health care provider. Contact your health care provider right away if  your urine is bright red or you cannot pass urine. This information is not intended to replace advice given to you by your health care provider. Make sure you discuss any questions you have with your health care provider. Document Revised: 03/13/2021 Document Reviewed: 03/13/2021 Elsevier Patient Education  2023 Elsevier Inc.        Post Anesthesia Home Care Instructions  Activity: Get plenty of  rest for the remainder of the day. A responsible individual must stay with you for 24 hours following the procedure.  For the next 24 hours, DO NOT: -Drive a car -Advertising copywriter -Drink alcoholic beverages -Take any medication unless instructed by your physician -Make any legal decisions or sign important papers.  Meals: Start with liquid foods such as gelatin or soup. Progress to regular foods as tolerated. Avoid greasy, spicy, heavy foods. If nausea and/or vomiting occur, drink only clear liquids until the nausea and/or vomiting subsides. Call your physician if vomiting continues.  Special Instructions/Symptoms: Your throat may feel dry or sore from the anesthesia or the breathing tube placed in your throat during surgery. If this causes discomfort, gargle with warm salt water. The discomfort should disappear within 24 hours.

## 2023-06-02 NOTE — Telephone Encounter (Signed)
CALLED PATIENT TO REMIND OF SIM APPT. FOR 06-04-23- ARRIVAL TIME- 12:45 PM @ [facility], INFORMED PATIENT TO ARRIVE WITH A FULL BLADDER, SPOKE WITH PATIENT'S WIFE- [name] AND SHE IS AWARE OF THIS APPT. AND THE INSTRUCTIONS

## 2023-06-02 NOTE — H&P (Signed)
H&P  Chief Complaint: Prostate cancer  History of Present Illness: Larry Martin is a 67 y.o. male with a diagnosis of prostate cancer. He he was referred back to Dr. Annabell Howells for evaluation of an elevated PSA of 5.5 on routine labs with his primary care physician, Dr. Allyne Gee. Of note, he had a previously elevated PSA at 14.9 in 09/2021 secondary to an E.coli UTI and the PSA decreased to 4.9 on repeat labs 10/18/21, after completing antibiotics. He met back with Dr. Annabell Howells on 12/08/22,  digital rectal examination performed at that time showed no abnormalities within limitation of prostate being high-riding. A repeat PSA obtained that day showed persistent elevation at 5.31. He underwent prostate MRI on 01/18/23 showing a PI-RADS 5 lesion in the left anterior peripheral zone. The patient proceeded to MRI fusion biopsy of the prostate on 02/25/23.  The prostate volume measured 58 cc.  Out of 15 core biopsies, 4 were positive.  The maximum Gleason score was 4+3, and this was seen in 2 of 3 samples from the MRI ROI as well as the left mid lateral, and left apex (small focus).   The patient met with Dr. Kathrynn Running and is moving forward with 5.5 weeks of external beam therapy.   He is here today for SpaceOAR and gold seed installation.  He has been well without dysuria or gross hematuria.  No cough cold or congestion.  No fever.  Past Medical History:  Diagnosis Date   BPH with obstruction/lower urinary tract symptoms    Chronic idiopathic constipation    ED (erectile dysfunction)    GERD (gastroesophageal reflux disease)    History of Helicobacter pylori infection 2019   treated   HLD (hyperlipidemia)    Hypertension    in epic -- 12-25-2014  nuclear stress study -- low risk, nuclear ef 62%;  echo ef 50-55%, G2DD, mild TR   Iron deficiency    Malignant neoplasm prostate [facility]) 01/2023   urologist-- dr wrenn/  radiation onologist--- dr Kathrynn Running;  dx 02/ 2024, gleason 4+3   Vitamin D deficiency    Weak  urine stream    Past Surgical History:  Procedure Laterality Date   COLONOSCOPY WITH ESOPHAGOGASTRODUODENOSCOPY (EGD)  10/12/2018   dr Meridee Score   NO PAST SURGERIES      Home Medications:  Medications Prior to Admission  Medication Sig Dispense Refill Last Dose   amLODipine (NORVASC) 5 MG tablet Take 1 tablet (5 mg total) by mouth daily. (Patient taking differently: Take 5 mg by mouth daily.) 90 tablet 1 06/02/2023 at 0530   atorvastatin (LIPITOR) 10 MG tablet Take 1 tablet (10 mg total) by mouth daily. (Patient taking differently: Take 10 mg by mouth at bedtime.) 90 tablet 1 06/02/2023 at 0530   Bisacodyl (LAXATIVE PO) Take by mouth as needed.   Past Month   omeprazole (PRILOSEC) 40 MG capsule Take 1 capsule (40 mg total) by mouth 2 (two) times daily. (Patient taking differently: Take 40 mg by mouth 2 (two) times daily as needed.) 90 capsule 1 06/02/2023 at 0530   Polyethyl Glycol-Propyl Glycol (SYSTANE OP) Apply to eye as needed (dry eyes).   Past Month   Probiotic Product (RESTORA) CAPS Take by mouth as needed.   Past Month   Simethicone (GAS-X PO) Take by mouth as needed.   Past Month   Allergies: No Known Allergies  Family History  Problem Relation Age of Onset   Blindness Mother        not leagally blind  Breast cancer Daughter    Colon cancer Neg Hx    Stomach cancer Neg Hx    Esophageal cancer Neg Hx    Inflammatory bowel disease Neg Hx    Liver disease Neg Hx    Pancreatic cancer Neg Hx    Rectal cancer Neg Hx    Social History:  reports that he has been smoking cigarettes. He has never used smokeless tobacco. He reports current alcohol use. He reports that he does not use drugs.  ROS: A complete review of systems was performed.  All systems are negative except for pertinent findings as noted. Review of Systems  All other systems reviewed and are negative.    Physical Exam:  Vital signs in last 24 hours: Temp:  [98.1 F (36.7 C)] 98.1 F (36.7 C) (06/04  0626) Pulse Rate:  [76] 76 (06/04 0626) Resp:  [17] 17 (06/04 0626) BP: (156)/(105) 156/105 (06/04 0626) SpO2:  [98 %] 98 % (06/04 0626) Weight:  [84.7 kg] 84.7 kg (06/04 0626) General:  Alert and oriented, No acute distress HEENT: Normocephalic, atraumatic Cardiovascular: Regular rate and rhythm Lungs: Regular rate and effort Abdomen: Soft, nontender, nondistended, no abdominal masses Back: No CVA tenderness Extremities: No edema Neurologic: Grossly intact  Laboratory Data:  Results for orders placed or performed during the hospital encounter of 06/02/23 (from the past 24 hour(s))  I-STAT, chem 8     Status: Abnormal   Collection Time: 06/02/23  6:47 AM  Result Value Ref Range   Sodium 143 135 - 145 mmol/L   Potassium 4.1 3.5 - 5.1 mmol/L   Chloride 104 98 - 111 mmol/L   BUN 9 8 - 23 mg/dL   Creatinine, Ser 1.61 (H) 0.61 - 1.24 mg/dL   Glucose, Bld 096 (H) 70 - 99 mg/dL   Calcium, Ion 0.45 4.09 - 1.40 mmol/L   TCO2 25 22 - 32 mmol/L   Hemoglobin 16.0 13.0 - 17.0 g/dL   HCT 81.1 91.4 - 78.2 %   No results found for this or any previous visit (from the past 240 hour(s)). Creatinine: Recent Labs    06/02/23 0647  CREATININE 1.30*    Impression/Assessment:  Prostate cancer  Plan:  I discussed with the patient and his wife the nature, potential benefits, risks and alternatives to gold seed marker implant and SpaceOAR biodegradable gel insertion, including side effects of the proposed treatment, the likelihood of the patient achieving the goals of the procedure, and any potential problems that might occur during the procedure or recuperation. I drew patient a picture of the anatomy.  We discussed risk of urethral and rectal injury among others.  All questions answered. Patient elects to proceed.   Jerilee Field 06/02/2023

## 2023-06-02 NOTE — Op Note (Signed)
Preoperative diagnosis: Prostate cancer Postoperative diagnosis: Prostate cancer  Procedure: Transrectal ultrasound-guided transperineal placement of gold seed prostate fiducial markers and SpaceOAR biodegradable gel  Surgeon: Mena Goes  Anesthesia: General  Indication for procedure: 67 yo male who is preparing to start external beam radiation.  He presents today for the above.  Findings: Normal-appearing prostate with intact capsule and no hypoechoic. Seminal vesicles appeared normal. Normal bladder.   Description of procedure: After consent was obtained patient brought to the operating room.  After adequate anesthesia he was placed in lithotomy position and the scrotum supported superiorly.  The perineum was prepped.  Ultrasound probe inserted rectally.  Prostate imaged in the axial and sagittal views.  The gold seed markers were passed transperineally under ultrasound guidance placing three total with one in the right base, right apex and left mid.  The 18-gauge needle was then inserted approximately 1 to 2 cm anterior to the anal opening and directed under ultrasonic guidance into the perirectal fat between the anterior rectal wall and the prostate capsule down to the mid-gland. Midline needle position was confirmed in the sagittal and axial views to verify the tip was in the perirectal fat.  Small amounts of saline were injected to hydrodissect the space between the prostate and the anterior rectal wall.  Axial imaging was viewed to confirm the needle was in the correct location in the mid gland and centered.  Aspiration confirmed no intravascular access.  The saline syringe was carefully disconnected maintaining the desired needle position and the hydrogel was attached to the needle.  Under ultrasound guidance in the sagittal view a smooth continuous injection was done over about 12 seconds delivering the hydrogel into the space between the prostate and rectal wall.  The needle was withdrawn.   A gentle rectal exam revealed no blood on the gloved finger and intact rectal mucosa.  A dressing was placed and the patient was awakened and taken to the cover room in stable condition.  Complications: None  Blood loss: Minimal  Specimens: None  Drains: None  Disposition: Patient stable to PACU

## 2023-06-03 ENCOUNTER — Encounter (HOSPITAL_BASED_OUTPATIENT_CLINIC_OR_DEPARTMENT_OTHER): Payer: Self-pay | Admitting: Urology

## 2023-06-03 NOTE — Anesthesia Postprocedure Evaluation (Signed)
Anesthesia Post Note  Patient: Larry Martin  Procedure(s) Performed: GOLD SEED IMPLANT (Prostate) SPACE OAR INSTILLATION (Prostate)     Patient location during evaluation: PACU Anesthesia Type: MAC Level of consciousness: awake and alert Pain management: pain level controlled Vital Signs Assessment: post-procedure vital signs reviewed and stable Respiratory status: spontaneous breathing, nonlabored ventilation, respiratory function stable and patient connected to nasal cannula oxygen Cardiovascular status: stable and blood pressure returned to baseline Postop Assessment: no apparent nausea or vomiting Anesthetic complications: no   No notable events documented.  Last Vitals:  Vitals:   06/02/23 0945 06/02/23 1011  BP: 118/81 (!) 151/98  Pulse: 65 68  Resp: 17 16  Temp:  (!) 36.4 C  SpO2: 93% 97%    Last Pain:  Vitals:   06/03/23 1051  TempSrc:   PainSc: 3                  Larry Martin S

## 2023-06-04 ENCOUNTER — Other Ambulatory Visit: Payer: Self-pay

## 2023-06-04 ENCOUNTER — Ambulatory Visit
Admission: RE | Admit: 2023-06-04 | Discharge: 2023-06-04 | Disposition: A | Discharge status: Home / Self Care | Payer: 59 | Source: Ambulatory Visit | Attending: Radiation Oncology | Admitting: Radiation Oncology

## 2023-06-04 DIAGNOSIS — C61 Malignant neoplasm of prostate: Secondary | ICD-10-CM | POA: Diagnosis present

## 2023-06-04 DIAGNOSIS — F17210 Nicotine dependence, cigarettes, uncomplicated: Secondary | ICD-10-CM | POA: Insufficient documentation

## 2023-06-04 DIAGNOSIS — Z510 Encounter for antineoplastic radiation therapy: Secondary | ICD-10-CM | POA: Insufficient documentation

## 2023-06-04 NOTE — Progress Notes (Signed)
  Radiation Oncology         [phone] ________________________________  Name: Larry Martin MRN: [id number]  Date: 06/04/2023  DOB: [birth year]  SIMULATION AND TREATMENT PLANNING NOTE    ICD-10-CM   1. Malignant neoplasm of prostate (HCC)  C61       DIAGNOSIS:   67 y.o. gentleman with Stage T1c adenocarcinoma of the prostate with Gleason Score of 4+3, and PSA of 5.31.   NARRATIVE:  The patient was brought to the CT Simulation planning suite.  Identity was confirmed.  All relevant records and images related to the planned course of therapy were reviewed.  The patient freely provided informed written consent to proceed with treatment after reviewing the details related to the planned course of therapy. The consent form was witnessed and verified by the simulation staff.  Then, the patient was set-up in a stable reproducible supine position for radiation therapy.  A vacuum lock pillow device was custom fabricated to position his legs in a reproducible immobilized position.  Then, I performed a urethrogram under sterile conditions to identify the prostatic apex.  CT images were obtained.  Surface markings were placed.  The CT images were loaded into the planning software.  Then the prostate target and avoidance structures including the rectum, bladder, bowel and hips were contoured.  Treatment planning then occurred.  The radiation prescription was entered and confirmed.  A total of one complex treatment devices was fabricated. I have requested : Intensity Modulated Radiotherapy (IMRT) is medically necessary for this case for the following reason:  Rectal sparing.Marland Kitchen  PLAN:  The patient will receive 70 Gy in 28 fractions.  ________________________________  Artist Pais Kathrynn Running, M.D.

## 2023-06-12 DIAGNOSIS — Z510 Encounter for antineoplastic radiation therapy: Secondary | ICD-10-CM | POA: Diagnosis not present

## 2023-06-18 NOTE — Progress Notes (Signed)
RN spoke with patient's wife to assess any questions or concerns prior to starting radiation treatment.  RN provided education on multi vitamin, RN advised he is safe to proceed with multi vitamin while under radiation treatment.  No additional needs at this time.

## 2023-06-22 ENCOUNTER — Other Ambulatory Visit: Payer: Self-pay

## 2023-06-22 ENCOUNTER — Ambulatory Visit
Admission: RE | Admit: 2023-06-22 | Discharge: 2023-06-22 | Disposition: A | Discharge status: Home / Self Care | Payer: 59 | Source: Ambulatory Visit | Attending: Radiation Oncology | Admitting: Radiation Oncology

## 2023-06-22 DIAGNOSIS — Z510 Encounter for antineoplastic radiation therapy: Secondary | ICD-10-CM | POA: Diagnosis not present

## 2023-06-22 LAB — RAD ONC ARIA SESSION SUMMARY
Course Elapsed Days: 0
Plan Fractions Treated to Date: 1
Plan Prescribed Dose Per Fraction: 2.5 Gy
Plan Total Fractions Prescribed: 28
Plan Total Prescribed Dose: 70 Gy
Reference Point Dosage Given to Date: 2.5 Gy
Reference Point Session Dosage Given: 2.5 Gy
Session Number: 1

## 2023-06-23 ENCOUNTER — Ambulatory Visit
Admission: RE | Admit: 2023-06-23 | Discharge: 2023-06-23 | Disposition: A | Discharge status: Home / Self Care | Payer: 59 | Source: Ambulatory Visit | Attending: Radiation Oncology | Admitting: Radiation Oncology

## 2023-06-23 ENCOUNTER — Other Ambulatory Visit: Payer: Self-pay

## 2023-06-23 DIAGNOSIS — Z510 Encounter for antineoplastic radiation therapy: Secondary | ICD-10-CM | POA: Diagnosis not present

## 2023-06-23 LAB — RAD ONC ARIA SESSION SUMMARY
Course Elapsed Days: 1
Plan Fractions Treated to Date: 2
Plan Prescribed Dose Per Fraction: 2.5 Gy
Plan Total Fractions Prescribed: 28
Plan Total Prescribed Dose: 70 Gy
Reference Point Dosage Given to Date: 5 Gy
Reference Point Session Dosage Given: 2.5 Gy
Session Number: 2

## 2023-06-24 ENCOUNTER — Ambulatory Visit
Admission: RE | Admit: 2023-06-24 | Discharge: 2023-06-24 | Disposition: A | Discharge status: Home / Self Care | Payer: 59 | Source: Ambulatory Visit | Attending: Radiation Oncology | Admitting: Radiation Oncology

## 2023-06-24 ENCOUNTER — Other Ambulatory Visit: Payer: Self-pay

## 2023-06-24 DIAGNOSIS — Z510 Encounter for antineoplastic radiation therapy: Secondary | ICD-10-CM | POA: Diagnosis not present

## 2023-06-24 LAB — RAD ONC ARIA SESSION SUMMARY
Course Elapsed Days: 2
Plan Fractions Treated to Date: 3
Plan Prescribed Dose Per Fraction: 2.5 Gy
Plan Total Fractions Prescribed: 28
Plan Total Prescribed Dose: 70 Gy
Reference Point Dosage Given to Date: 7.5 Gy
Reference Point Session Dosage Given: 2.5 Gy
Session Number: 3

## 2023-06-25 ENCOUNTER — Other Ambulatory Visit: Payer: Self-pay

## 2023-06-25 ENCOUNTER — Ambulatory Visit
Admission: RE | Admit: 2023-06-25 | Discharge: 2023-06-25 | Disposition: A | Discharge status: Home / Self Care | Payer: 59 | Source: Ambulatory Visit | Attending: Radiation Oncology | Admitting: Radiation Oncology

## 2023-06-25 DIAGNOSIS — Z510 Encounter for antineoplastic radiation therapy: Secondary | ICD-10-CM | POA: Diagnosis not present

## 2023-06-25 LAB — RAD ONC ARIA SESSION SUMMARY
Course Elapsed Days: 3
Plan Fractions Treated to Date: 4
Plan Prescribed Dose Per Fraction: 2.5 Gy
Plan Total Fractions Prescribed: 28
Plan Total Prescribed Dose: 70 Gy
Reference Point Dosage Given to Date: 10 Gy
Reference Point Session Dosage Given: 2.5 Gy
Session Number: 4

## 2023-06-26 ENCOUNTER — Other Ambulatory Visit: Payer: Self-pay

## 2023-06-26 ENCOUNTER — Ambulatory Visit
Admission: RE | Admit: 2023-06-26 | Discharge: 2023-06-26 | Disposition: A | Discharge status: Home / Self Care | Payer: 59 | Source: Ambulatory Visit | Attending: Radiation Oncology | Admitting: Radiation Oncology

## 2023-06-26 DIAGNOSIS — Z510 Encounter for antineoplastic radiation therapy: Secondary | ICD-10-CM | POA: Diagnosis not present

## 2023-06-26 LAB — RAD ONC ARIA SESSION SUMMARY
Course Elapsed Days: 4
Plan Fractions Treated to Date: 5
Plan Prescribed Dose Per Fraction: 2.5 Gy
Plan Total Fractions Prescribed: 28
Plan Total Prescribed Dose: 70 Gy
Reference Point Dosage Given to Date: 12.5 Gy
Reference Point Session Dosage Given: 2.5 Gy
Session Number: 5

## 2023-06-29 ENCOUNTER — Other Ambulatory Visit: Payer: Self-pay

## 2023-06-29 ENCOUNTER — Ambulatory Visit
Admission: RE | Admit: 2023-06-29 | Discharge: 2023-06-29 | Disposition: A | Discharge status: Home / Self Care | Payer: 59 | Source: Ambulatory Visit | Attending: Radiation Oncology | Admitting: Radiation Oncology

## 2023-06-29 DIAGNOSIS — C61 Malignant neoplasm of prostate: Secondary | ICD-10-CM | POA: Diagnosis present

## 2023-06-29 LAB — RAD ONC ARIA SESSION SUMMARY
Course Elapsed Days: 7
Plan Fractions Treated to Date: 6
Plan Prescribed Dose Per Fraction: 2.5 Gy
Plan Total Fractions Prescribed: 28
Plan Total Prescribed Dose: 70 Gy
Reference Point Dosage Given to Date: 15 Gy
Reference Point Session Dosage Given: 2.5 Gy
Session Number: 6

## 2023-06-30 ENCOUNTER — Ambulatory Visit
Admission: RE | Admit: 2023-06-30 | Discharge: 2023-06-30 | Disposition: A | Discharge status: Home / Self Care | Payer: 59 | Source: Ambulatory Visit | Attending: Radiation Oncology | Admitting: Radiation Oncology

## 2023-06-30 ENCOUNTER — Other Ambulatory Visit: Payer: Self-pay

## 2023-06-30 DIAGNOSIS — C61 Malignant neoplasm of prostate: Secondary | ICD-10-CM | POA: Diagnosis not present

## 2023-06-30 LAB — RAD ONC ARIA SESSION SUMMARY
Course Elapsed Days: 8
Plan Fractions Treated to Date: 7
Plan Prescribed Dose Per Fraction: 2.5 Gy
Plan Total Fractions Prescribed: 28
Plan Total Prescribed Dose: 70 Gy
Reference Point Dosage Given to Date: 17.5 Gy
Reference Point Session Dosage Given: 2.5 Gy
Session Number: 7

## 2023-07-01 ENCOUNTER — Ambulatory Visit
Admission: RE | Admit: 2023-07-01 | Discharge: 2023-07-01 | Disposition: A | Discharge status: Home / Self Care | Payer: 59 | Source: Ambulatory Visit | Attending: Radiation Oncology | Admitting: Radiation Oncology

## 2023-07-01 ENCOUNTER — Other Ambulatory Visit: Payer: Self-pay

## 2023-07-01 DIAGNOSIS — C61 Malignant neoplasm of prostate: Secondary | ICD-10-CM | POA: Diagnosis not present

## 2023-07-01 LAB — RAD ONC ARIA SESSION SUMMARY
Course Elapsed Days: 9
Plan Fractions Treated to Date: 8
Plan Prescribed Dose Per Fraction: 2.5 Gy
Plan Total Fractions Prescribed: 28
Plan Total Prescribed Dose: 70 Gy
Reference Point Dosage Given to Date: 20 Gy
Reference Point Session Dosage Given: 2.5 Gy
Session Number: 8

## 2023-07-03 ENCOUNTER — Ambulatory Visit
Admission: RE | Admit: 2023-07-03 | Discharge: 2023-07-03 | Disposition: A | Discharge status: Home / Self Care | Payer: 59 | Source: Ambulatory Visit | Attending: Radiation Oncology | Admitting: Radiation Oncology

## 2023-07-03 ENCOUNTER — Other Ambulatory Visit: Payer: Self-pay

## 2023-07-03 DIAGNOSIS — C61 Malignant neoplasm of prostate: Secondary | ICD-10-CM | POA: Diagnosis not present

## 2023-07-03 LAB — RAD ONC ARIA SESSION SUMMARY
Course Elapsed Days: 11
Plan Fractions Treated to Date: 9
Plan Prescribed Dose Per Fraction: 2.5 Gy
Plan Total Fractions Prescribed: 28
Plan Total Prescribed Dose: 70 Gy
Reference Point Dosage Given to Date: 22.5 Gy
Reference Point Session Dosage Given: 2.5 Gy
Session Number: 9

## 2023-07-06 ENCOUNTER — Ambulatory Visit
Admission: RE | Admit: 2023-07-06 | Discharge: 2023-07-06 | Disposition: A | Discharge status: Home / Self Care | Payer: 59 | Source: Ambulatory Visit | Attending: Radiation Oncology | Admitting: Radiation Oncology

## 2023-07-06 ENCOUNTER — Other Ambulatory Visit: Payer: Self-pay

## 2023-07-06 DIAGNOSIS — C61 Malignant neoplasm of prostate: Secondary | ICD-10-CM | POA: Diagnosis not present

## 2023-07-06 LAB — RAD ONC ARIA SESSION SUMMARY
Course Elapsed Days: 14
Plan Fractions Treated to Date: 10
Plan Prescribed Dose Per Fraction: 2.5 Gy
Plan Total Fractions Prescribed: 28
Plan Total Prescribed Dose: 70 Gy
Reference Point Dosage Given to Date: 25 Gy
Reference Point Session Dosage Given: 2.5 Gy
Session Number: 10

## 2023-07-07 ENCOUNTER — Ambulatory Visit
Admission: RE | Admit: 2023-07-07 | Discharge: 2023-07-07 | Disposition: A | Discharge status: Home / Self Care | Payer: 59 | Source: Ambulatory Visit | Attending: Radiation Oncology | Admitting: Radiation Oncology

## 2023-07-07 ENCOUNTER — Other Ambulatory Visit: Payer: Self-pay

## 2023-07-07 DIAGNOSIS — C61 Malignant neoplasm of prostate: Secondary | ICD-10-CM | POA: Diagnosis not present

## 2023-07-07 LAB — RAD ONC ARIA SESSION SUMMARY
Course Elapsed Days: 15
Plan Fractions Treated to Date: 11
Plan Prescribed Dose Per Fraction: 2.5 Gy
Plan Total Fractions Prescribed: 28
Plan Total Prescribed Dose: 70 Gy
Reference Point Dosage Given to Date: 27.5 Gy
Reference Point Session Dosage Given: 2.5 Gy
Session Number: 11

## 2023-07-08 ENCOUNTER — Other Ambulatory Visit: Payer: Self-pay

## 2023-07-08 ENCOUNTER — Ambulatory Visit
Admission: RE | Admit: 2023-07-08 | Discharge: 2023-07-08 | Disposition: A | Discharge status: Home / Self Care | Payer: 59 | Source: Ambulatory Visit | Attending: Radiation Oncology | Admitting: Radiation Oncology

## 2023-07-08 DIAGNOSIS — C61 Malignant neoplasm of prostate: Secondary | ICD-10-CM | POA: Diagnosis not present

## 2023-07-08 LAB — RAD ONC ARIA SESSION SUMMARY
Course Elapsed Days: 16
Plan Fractions Treated to Date: 12
Plan Prescribed Dose Per Fraction: 2.5 Gy
Plan Total Fractions Prescribed: 28
Plan Total Prescribed Dose: 70 Gy
Reference Point Dosage Given to Date: 30 Gy
Reference Point Session Dosage Given: 2.5 Gy
Session Number: 12

## 2023-07-09 ENCOUNTER — Ambulatory Visit: Payer: 59

## 2023-07-10 ENCOUNTER — Ambulatory Visit
Admission: RE | Admit: 2023-07-10 | Discharge: 2023-07-10 | Disposition: A | Discharge status: Home / Self Care | Payer: 59 | Source: Ambulatory Visit | Attending: Radiation Oncology | Admitting: Radiation Oncology

## 2023-07-10 ENCOUNTER — Other Ambulatory Visit: Payer: Self-pay | Admitting: Radiation Oncology

## 2023-07-10 ENCOUNTER — Ambulatory Visit: Payer: 59

## 2023-07-10 ENCOUNTER — Other Ambulatory Visit: Payer: Self-pay

## 2023-07-10 ENCOUNTER — Telehealth: Payer: Self-pay | Admitting: *Deleted

## 2023-07-10 DIAGNOSIS — C61 Malignant neoplasm of prostate: Secondary | ICD-10-CM

## 2023-07-10 LAB — URINALYSIS, COMPLETE (UACMP) WITH MICROSCOPIC
Bacteria, UA: NONE SEEN
Bilirubin Urine: NEGATIVE
Glucose, UA: NEGATIVE mg/dL
Hgb urine dipstick: NEGATIVE
Ketones, ur: NEGATIVE mg/dL
Nitrite: NEGATIVE
Protein, ur: NEGATIVE mg/dL
Specific Gravity, Urine: 1.014 (ref 1.005–1.030)
pH: 6 (ref 5.0–8.0)

## 2023-07-10 LAB — RAD ONC ARIA SESSION SUMMARY
Course Elapsed Days: 18
Plan Fractions Treated to Date: 13
Plan Prescribed Dose Per Fraction: 2.5 Gy
Plan Total Fractions Prescribed: 28
Plan Total Prescribed Dose: 70 Gy
Reference Point Dosage Given to Date: 32.5 Gy
Reference Point Session Dosage Given: 2.5 Gy
Session Number: 13

## 2023-07-10 MED ORDER — TAMSULOSIN HCL 0.4 MG PO CAPS: 0.4000 mg | ORAL_CAPSULE | Freq: Every day | ORAL | 1 refills | Status: DC

## 2023-07-10 NOTE — Telephone Encounter (Signed)
Spoke with the patient's wife to let her know that his urinalysis did not show any signs of infection.  I let her know per MD he should increase his fluid intake and a prescription will be sent in for Flomax to his pharmacy.  She verbalized understanding.  Lind Covert RN, BSN

## 2023-07-11 ENCOUNTER — Encounter (HOSPITAL_BASED_OUTPATIENT_CLINIC_OR_DEPARTMENT_OTHER): Payer: Self-pay

## 2023-07-11 ENCOUNTER — Emergency Department (HOSPITAL_BASED_OUTPATIENT_CLINIC_OR_DEPARTMENT_OTHER): Payer: 59 | Admitting: Radiology

## 2023-07-11 ENCOUNTER — Other Ambulatory Visit: Payer: Self-pay

## 2023-07-11 ENCOUNTER — Emergency Department (HOSPITAL_BASED_OUTPATIENT_CLINIC_OR_DEPARTMENT_OTHER)
Admission: EM | Admit: 2023-07-11 | Discharge: 2023-07-11 | Disposition: A | Discharge status: Home / Self Care | Payer: 59 | Attending: Emergency Medicine | Admitting: Emergency Medicine

## 2023-07-11 DIAGNOSIS — K5900 Constipation, unspecified: Secondary | ICD-10-CM | POA: Insufficient documentation

## 2023-07-11 DIAGNOSIS — Z79899 Other long term (current) drug therapy: Secondary | ICD-10-CM | POA: Insufficient documentation

## 2023-07-11 DIAGNOSIS — R3915 Urgency of urination: Secondary | ICD-10-CM | POA: Insufficient documentation

## 2023-07-11 DIAGNOSIS — I10 Essential (primary) hypertension: Secondary | ICD-10-CM | POA: Insufficient documentation

## 2023-07-11 LAB — URINALYSIS, ROUTINE W REFLEX MICROSCOPIC
Bilirubin Urine: NEGATIVE
Glucose, UA: NEGATIVE mg/dL
Hgb urine dipstick: NEGATIVE
Ketones, ur: NEGATIVE mg/dL
Leukocytes,Ua: NEGATIVE
Nitrite: NEGATIVE
Protein, ur: NEGATIVE mg/dL
Specific Gravity, Urine: 1.012 (ref 1.005–1.030)
pH: 6.5 (ref 5.0–8.0)

## 2023-07-11 LAB — URINE CULTURE: Culture: NO GROWTH

## 2023-07-11 MED ORDER — TAMSULOSIN HCL 0.4 MG PO CAPS
0.4000 mg | ORAL_CAPSULE | Freq: Once | ORAL | Status: AC
  Administered 2023-07-11: 0.4 mg via ORAL
  Filled 2023-07-11: qty 1

## 2023-07-11 NOTE — ED Triage Notes (Signed)
Difficulty urinating and constipation x a few days, believes it may be related to his prostate cancer treatment. Has the urge to urinate and has attempted without much success. Symptoms onset within the last 3 days. Was sent rx by PMD for flomax but has not started it yet.

## 2023-07-11 NOTE — ED Provider Notes (Signed)
[name] EMERGENCY DEPARTMENT AT [facility] Provider Note   CSN: [id number] Arrival date & time: 07/11/23  0034     History  Chief Complaint  Patient presents with   Urine Output    Larry Martin is a 67 y.o. male.  HPI     This is a 67 year old male currently undergoing treatment for prostate cancer who presents with urgency and concerns for constipation.  Patient reports that he has had pain with trying to have a bowel movement.  He reports hard bowel movements.  Patient also reports an urgency to pee but very minimal urinary output.  He was able to urinate a small amount prior to coming in.  He was prescribed Flomax by his doctor but has been unable to get the prescription filled.  He denies any significant abdominal pain.  Home Medications Prior to Admission medications   Medication Sig Start Date End Date Taking? Authorizing Provider  amLODipine (NORVASC) 5 MG tablet Take 1 tablet (5 mg total) by mouth daily. Patient taking differently: Take 5 mg by mouth daily. 04/16/23   Arnette Felts, FNP  atorvastatin (LIPITOR) 10 MG tablet Take 1 tablet (10 mg total) by mouth daily. Patient taking differently: Take 10 mg by mouth at bedtime. 04/16/23   Arnette Felts, FNP  Bisacodyl (LAXATIVE PO) Take by mouth as needed.    [provider]  omeprazole (PRILOSEC) 40 MG capsule Take 1 capsule (40 mg total) by mouth 2 (two) times daily. Patient taking differently: Take 40 mg by mouth 2 (two) times daily as needed. 04/15/22   Arnette Felts, FNP  Polyethyl Glycol-Propyl Glycol (SYSTANE OP) Apply to eye as needed (dry eyes).    [provider]  Probiotic Product (RESTORA) CAPS Take by mouth as needed.    [provider]  Simethicone (GAS-X PO) Take by mouth as needed.    [provider]  tamsulosin (FLOMAX) 0.4 MG CAPS capsule Take 1 capsule (0.4 mg total) by mouth daily. 07/10/23   Dorothy Puffer, MD      Allergies    Patient has no known  allergies.    Review of Systems   Review of Systems  Gastrointestinal:  Positive for constipation. Negative for abdominal pain, nausea and vomiting.  Genitourinary:  Positive for decreased urine volume and urgency. Negative for dysuria.  All other systems reviewed and are negative.   Physical Exam Updated Vital Signs BP (!) 155/113   Pulse 83   Temp 98.6 F (37 C)   Resp 16   Ht 1.676 m (5\' 6" )   Wt 83.5 kg   SpO2 98%   BMI 29.70 kg/m  Physical Exam Vitals and nursing note reviewed.  Constitutional:      Appearance: He is well-developed. He is not ill-appearing.  HENT:     Head: Normocephalic and atraumatic.  Eyes:     Pupils: Pupils are equal, round, and reactive to light.  Cardiovascular:     Rate and Rhythm: Normal rate and regular rhythm.     Heart sounds: Normal heart sounds.  Pulmonary:     Effort: Pulmonary effort is normal. No respiratory distress.     Breath sounds: Normal breath sounds. No wheezing.  Abdominal:     General: Bowel sounds are normal.     Palpations: Abdomen is soft.     Tenderness: There is no abdominal tenderness. There is no rebound.  Musculoskeletal:     Cervical back: Neck supple.  Lymphadenopathy:     Cervical: No  cervical adenopathy.  Skin:    General: Skin is warm and dry.  Neurological:     Mental Status: He is alert and oriented to person, place, and time.  Psychiatric:        Mood and Affect: Mood normal.     ED Results / Procedures / Treatments   Labs (all labs ordered are listed, but only abnormal results are displayed) Labs Reviewed  URINALYSIS, ROUTINE W REFLEX MICROSCOPIC - Abnormal; Notable for the following components:      Result Value   Color, Urine COLORLESS (*)    All other components within normal limits    EKG None  Radiology DG Abdomen 1 View  Result Date: 07/11/2023 CLINICAL DATA:  Constipation EXAM: ABDOMEN - 1 VIEW COMPARISON:  None Available. FINDINGS: The bowel gas pattern is normal. Small volume  stool noted within the proximal colon. Metallic densities overlying the expected prostate gland likely represent fiducial markers. No radio-opaque calculi or other significant radiographic abnormality are seen. IMPRESSION: 1. Small volume stool. No evidence of bowel obstruction. Electronically Signed   By: Helyn Numbers M.D.   On: 07/11/2023 02:47    Procedures Procedures    Medications Ordered in ED Medications  tamsulosin (FLOMAX) capsule 0.4 mg (0.4 mg Oral Given 07/11/23 0203)    ED Course/ Medical Decision Making/ A&P                             Medical Decision Making Amount and/or Complexity of Data Reviewed Labs: ordered. Radiology: ordered.  Risk Prescription drug management.   This patient presents to the ED for concern of constipation, urinary urgency, this involves an extensive number of treatment options, and is a complaint that carries with it a high risk of complications and morbidity.  I considered the following differential and admission for this acute, potentially life threatening condition.  The differential diagnosis includes constipation, urinary retention, UTI, cancer treatment related side effects  MDM:    This is a 67 year old male who presents with urinary urgency and concern for constipation.  He is nontoxic and vital signs are reassuring.  He is afebrile.  He is able to spontaneously void.  Bladder scan 27 cc at the bedside.  In and out cath without any significant volume.  KUB of the abdomen shows a small stool burden.  Urinalysis is not consistent with UTI.  Patient was given a dose of Flomax here.  Discussed with him that he should start a bowel regimen to make sure that he is keeping stools soft.  Would otherwise follow-up with urology and oncology regarding ongoing urinary complaints.  (Labs, imaging, consults)  Labs: I Ordered, and personally interpreted labs.  The pertinent results include: Urinalysis  Imaging Studies ordered: I ordered imaging  studies including KUB I independently visualized and interpreted imaging. I agree with the radiologist interpretation  Additional history obtained from wife at bedside.  External records from outside source obtained and reviewed including prior evaluations  Cardiac Monitoring: The patient was maintained on a cardiac monitor.  If on the cardiac monitor, I personally viewed and interpreted the cardiac monitored which showed an underlying rhythm of: Sinus rhythm  Reevaluation: After the interventions noted above, I reevaluated the patient and found that they have :improved  Social Determinants of Health:  lives independently  Disposition: Discharge  Co morbidities that complicate the patient evaluation  Past Medical History:  Diagnosis Date   BPH with obstruction/lower urinary tract symptoms  Chronic idiopathic constipation    ED (erectile dysfunction)    GERD (gastroesophageal reflux disease)    History of Helicobacter pylori infection 2019   treated   HLD (hyperlipidemia)    Hypertension    in epic -- 12-25-2014  nuclear stress study -- low risk, nuclear ef 62%;  echo ef 50-55%, G2DD, mild TR   Iron deficiency    Malignant neoplasm prostate [facility]) 01/2023   urologist-- dr wrenn/  radiation onologist--- dr Kathrynn Running;  dx 02/ 2024, gleason 4+3   Vitamin D deficiency    Weak urine stream      Medicines Meds ordered this encounter  Medications   tamsulosin (FLOMAX) capsule 0.4 mg    I have reviewed the patients home medicines and have made adjustments as needed  Problem List / ED Course: Problem List Items Addressed This Visit       Other   Constipation - Primary   Other Visit Diagnoses     Urinary urgency                       Final Clinical Impression(s) / ED Diagnoses Final diagnoses:  Constipation, unspecified constipation type  Urinary urgency    Rx / DC Orders ED Discharge Orders     None         Shon Baton, MD 07/11/23  [phone]

## 2023-07-11 NOTE — Discharge Instructions (Signed)
He was seen today for concerns for urinary urgency and constipation.  Your workup today is reassuring.  It does not appear that you are retaining a significant amount of urine.  Take Flomax as directed by your doctor.  Start MiraLAX twice daily.  You may do 1 capful twice daily until stools are soft.  Follow-up with urology and oncology.

## 2023-07-11 NOTE — ED Notes (Signed)
IN and out cath performed. Catheter went in to within 1inch of end of tubing with no urine obtained. Catheter felt to be straight inside the penis. Dr.Horton notified.

## 2023-07-13 ENCOUNTER — Other Ambulatory Visit: Payer: Self-pay

## 2023-07-13 ENCOUNTER — Ambulatory Visit: Admission: RE | Admit: 2023-07-13 | Payer: 59 | Source: Ambulatory Visit

## 2023-07-13 DIAGNOSIS — C61 Malignant neoplasm of prostate: Secondary | ICD-10-CM | POA: Diagnosis not present

## 2023-07-13 LAB — RAD ONC ARIA SESSION SUMMARY
Course Elapsed Days: 21
Plan Fractions Treated to Date: 14
Plan Prescribed Dose Per Fraction: 2.5 Gy
Plan Total Fractions Prescribed: 28
Plan Total Prescribed Dose: 70 Gy
Reference Point Dosage Given to Date: 35 Gy
Reference Point Session Dosage Given: 2.5 Gy
Session Number: 14

## 2023-07-13 NOTE — Progress Notes (Signed)
RN returned patient's wife call regarding recent ED visit for dysuria and constipation.    Patient was given Flomax at recent visit from Dr. Mitzi Hansen, however, they were unable to get it filled at pharmacy prior to ED.  Patient is under active radiation treatment.   RN spoke with wife and they are being worked in with urology at this time.    Will continue to follow.

## 2023-07-14 ENCOUNTER — Ambulatory Visit
Admission: RE | Admit: 2023-07-14 | Discharge: 2023-07-14 | Disposition: A | Discharge status: Home / Self Care | Payer: 59 | Source: Ambulatory Visit | Attending: Radiation Oncology | Admitting: Radiation Oncology

## 2023-07-14 ENCOUNTER — Other Ambulatory Visit: Payer: Self-pay

## 2023-07-14 ENCOUNTER — Telehealth: Payer: Self-pay

## 2023-07-14 DIAGNOSIS — C61 Malignant neoplasm of prostate: Secondary | ICD-10-CM | POA: Diagnosis not present

## 2023-07-14 LAB — RAD ONC ARIA SESSION SUMMARY
Course Elapsed Days: 22
Plan Fractions Treated to Date: 15
Plan Prescribed Dose Per Fraction: 2.5 Gy
Plan Total Fractions Prescribed: 28
Plan Total Prescribed Dose: 70 Gy
Reference Point Dosage Given to Date: 37.5 Gy
Reference Point Session Dosage Given: 2.5 Gy
Session Number: 15

## 2023-07-14 NOTE — Progress Notes (Signed)
Patient saw urology on 7/15 for his worsening urinary and rectal urgency. RN spoke with patient's wife to confirm they are continuing with Flomax as recommend along with additional medication recommendations of Levsin.  Patient reports noticing improvement since starting medication, Levsin. RN encouraged patient and patients wife to continue to monitor for urinary symptoms.  Verbalized understanding and agreement.

## 2023-07-14 NOTE — Transitions of Care (Post Inpatient/ED Visit) (Signed)
   07/14/2023  Name: Larry Martin MRN: [id number] DOB: Oct 12, 1956  Today's TOC FU Call Status: Today's TOC FU Call Status:: Successful TOC FU Call Competed TOC FU Call Complete Date: 07/14/23  Transition Care Management Follow-up Telephone Call Date of Discharge: 07/11/23 Discharge Facility: [facility] ([facility]) Type of Discharge: Inpatient Admission Primary Inpatient Discharge Diagnosis:: constipation How have you been since you were released from the hospital?: Better Any questions or concerns?: No  Items Reviewed: Did you receive and understand the discharge instructions provided?: Yes Medications obtained,verified, and reconciled?: Yes (Medications Reviewed) Any new allergies since your discharge?: No Dietary orders reviewed?: No Do you have support at home?: Yes People in Home: spouse  Medications Reviewed Today: Medications Reviewed Today     Reviewed by Marlyn Corporal, CMA (Certified Medical Assistant) on 07/14/23 at 1723  Med List Status: <None>   Medication Order Taking? Sig Documenting Provider Last Dose Status Informant  amLODipine (NORVASC) 5 MG tablet [id number] Yes Take 1 tablet (5 mg total) by mouth daily.  Patient taking differently: Take 5 mg by mouth daily.   Arnette Felts, FNP Taking Active Self, Spouse/Significant Other  atorvastatin (LIPITOR) 10 MG tablet [id number] Yes Take 1 tablet (10 mg total) by mouth daily.  Patient taking differently: Take 10 mg by mouth at bedtime.   Arnette Felts, FNP Taking Active Self, Spouse/Significant Other  Bisacodyl (LAXATIVE PO) [id number] Yes Take by mouth as needed. [provider] Taking Active Self, Spouse/Significant Other  omeprazole (PRILOSEC) 40 MG capsule [id number] Yes Take 1 capsule (40 mg total) by mouth 2 (two) times daily.  Patient taking differently: Take 40 mg by mouth 2 (two) times daily as needed.   Arnette Felts, FNP Taking Active Self, Spouse/Significant Other  Polyethyl Glycol-Propyl  Glycol (SYSTANE OP) [id number] Yes Apply to eye as needed (dry eyes). [provider] Taking Active Self, Spouse/Significant Other  Probiotic Product (RESTORA) CAPS [id number] Yes Take by mouth as needed. [provider] Taking Active Self, Spouse/Significant Other  Simethicone (GAS-X PO) [id number] Yes Take by mouth as needed. [provider] Taking Active Self, Spouse/Significant Other  tamsulosin (FLOMAX) 0.4 MG CAPS capsule [id number] Yes Take 1 capsule (0.4 mg total) by mouth daily. Dorothy Puffer, MD Taking Active             Home Care and Equipment/Supplies: Were Home Health Services Ordered?: No Any new equipment or medical supplies ordered?: No  Functional Questionnaire: Do you need assistance with bathing/showering or dressing?: No Do you need assistance with meal preparation?: No Do you need assistance with eating?: No Do you have difficulty maintaining continence: No Do you need assistance with getting out of bed/getting out of a chair/moving?: No Do you have difficulty managing or taking your medications?: No  Follow up appointments reviewed: PCP Follow-up appointment confirmed?: No MD Provider Line Number:[phone] Given: Yes Specialist Hospital Follow-up appointment confirmed?: Yes Date of Specialist follow-up appointment?: 07/14/23 Do you need transportation to your follow-up appointment?: No Do you understand care options if your condition(s) worsen?: Yes-patient verbalized understanding    SIGNATURE Lisabeth Devoid, CMA

## 2023-07-15 ENCOUNTER — Other Ambulatory Visit: Payer: Self-pay

## 2023-07-15 ENCOUNTER — Ambulatory Visit
Admission: RE | Admit: 2023-07-15 | Discharge: 2023-07-15 | Disposition: A | Discharge status: Home / Self Care | Payer: 59 | Source: Ambulatory Visit | Attending: Radiation Oncology | Admitting: Radiation Oncology

## 2023-07-15 DIAGNOSIS — C61 Malignant neoplasm of prostate: Secondary | ICD-10-CM | POA: Diagnosis not present

## 2023-07-15 LAB — RAD ONC ARIA SESSION SUMMARY
Course Elapsed Days: 23
Plan Fractions Treated to Date: 16
Plan Prescribed Dose Per Fraction: 2.5 Gy
Plan Total Fractions Prescribed: 28
Plan Total Prescribed Dose: 70 Gy
Reference Point Dosage Given to Date: 40 Gy
Reference Point Session Dosage Given: 2.5 Gy
Session Number: 16

## 2023-07-16 ENCOUNTER — Encounter: Payer: Self-pay | Admitting: Urology

## 2023-07-16 ENCOUNTER — Other Ambulatory Visit: Payer: Self-pay | Admitting: Urology

## 2023-07-16 ENCOUNTER — Ambulatory Visit: Payer: 59

## 2023-07-16 MED ORDER — HYOSCYAMINE SULFATE 0.125 MG SL SUBL: 0.1250 mg | SUBLINGUAL_TABLET | SUBLINGUAL | Status: DC | PRN

## 2023-07-16 MED ORDER — MIRABEGRON ER 25 MG PO TB24: 25.0000 mg | ORAL_TABLET | Freq: Every day | ORAL | 2 refills | Status: DC

## 2023-07-16 NOTE — Progress Notes (Signed)
Patient's wife, Selena Batten, called RN to discuss concerns regarding radiation side effects.    Pt is under active radiation treatment and has completed 16 of his 25 treatments for his prostate cancer.  Pt continues to have c/o constipation. He went to ED on 7/13 they did a KUB with no evidence of obstruction. patient is currently taking Miralax, stool softeners (am/pm), and recently used magnesium citrate (1/2 bottled last night) with small effectiveness. Patient's wife reports that he is drinking plenty of water (at least 64oz), and is not sedentary. Reports pain in lower abd, however, he is flatulent. Was previously having concerns with urination and saw Dr. Lafonda Mosses at [facility] and he encouraged him to continue Flomax and also placed him on Levsin. His urinary symptoms have improved with these medications.   RN reviewed with provider, additional recommendation to take remainder of magnesium citrate.  RN encouraged patient to continue with fluids, and encouraged increase in mobility.  Provider sent Myrbetriq if urinary symptoms return with use of Levsin.  Patient knows not to take both medications at same time due to increasing chance of urinary retention.  Patient plans to remain on Levsin at this time since improvement.

## 2023-07-16 NOTE — Progress Notes (Signed)
Patient's wife called stating that Larry Martin will not be in for treatment today at 1:30 pm due to extreme side effects from radiation:  frequency, urinary urgency with little urine output, and dysuria.  He plans to resume treatment tomorrow when they will see the doctor for PUT.  Larry Martin has been tested for UTI per Dr. Mitzi Hansen on 07/10/2023 which was negative for UTI.  During PUT visit with Dr. Mitzi Hansen last week, patient was advised to increase water intake and prescribed Flomax.  Patient was already taking AZO.  He was in ED on 07/11/2023 with same urinary complaints, rectal discomfort, and constipation. UA was again negative for UTI, and there was no evidence of urine retention, or bowel obstruction. He was advised to try bowel regimen per the hospital doctor and continue taking Flomax and AZO but nothing seems to be alleviating his symptoms.  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 Monica, RN will call patient to inform him to pick up the Rx for myrbetriq and also advise to try taking ibuprofen 600 mg po BID with food to help with the inflammation. He can also try sitz baths prn and will report progress at the time of his PUT visit tomorrow following treatment.  Marguarite Arbour, MMS, PA-C [facility] Radiation Oncology Physician Assistant Direct Dial: [phone]  Fax: [phone]

## 2023-07-17 ENCOUNTER — Ambulatory Visit
Admission: RE | Admit: 2023-07-17 | Discharge: 2023-07-17 | Disposition: A | Discharge status: Home / Self Care | Payer: 59 | Source: Ambulatory Visit | Attending: Radiation Oncology | Admitting: Radiation Oncology

## 2023-07-17 ENCOUNTER — Other Ambulatory Visit: Payer: Self-pay

## 2023-07-17 DIAGNOSIS — C61 Malignant neoplasm of prostate: Secondary | ICD-10-CM | POA: Diagnosis not present

## 2023-07-17 LAB — RAD ONC ARIA SESSION SUMMARY
Course Elapsed Days: 25
Plan Fractions Treated to Date: 17
Plan Prescribed Dose Per Fraction: 2.5 Gy
Plan Total Fractions Prescribed: 28
Plan Total Prescribed Dose: 70 Gy
Reference Point Dosage Given to Date: 42.5 Gy
Reference Point Session Dosage Given: 2.5 Gy
Session Number: 17

## 2023-07-20 ENCOUNTER — Ambulatory Visit
Admission: RE | Admit: 2023-07-20 | Discharge: 2023-07-20 | Disposition: A | Discharge status: Home / Self Care | Payer: 59 | Source: Ambulatory Visit | Attending: Radiation Oncology | Admitting: Radiation Oncology

## 2023-07-20 ENCOUNTER — Other Ambulatory Visit: Payer: Self-pay

## 2023-07-20 DIAGNOSIS — C61 Malignant neoplasm of prostate: Secondary | ICD-10-CM | POA: Diagnosis not present

## 2023-07-20 LAB — RAD ONC ARIA SESSION SUMMARY
Course Elapsed Days: 28
Plan Fractions Treated to Date: 18
Plan Prescribed Dose Per Fraction: 2.5 Gy
Plan Total Fractions Prescribed: 28
Plan Total Prescribed Dose: 70 Gy
Reference Point Dosage Given to Date: 45 Gy
Reference Point Session Dosage Given: 2.5 Gy
Session Number: 18

## 2023-07-21 ENCOUNTER — Other Ambulatory Visit: Payer: Self-pay

## 2023-07-21 ENCOUNTER — Ambulatory Visit
Admission: RE | Admit: 2023-07-21 | Discharge: 2023-07-21 | Disposition: A | Discharge status: Home / Self Care | Payer: 59 | Source: Ambulatory Visit | Attending: Radiation Oncology | Admitting: Radiation Oncology

## 2023-07-21 DIAGNOSIS — C61 Malignant neoplasm of prostate: Secondary | ICD-10-CM | POA: Diagnosis not present

## 2023-07-21 LAB — RAD ONC ARIA SESSION SUMMARY
Course Elapsed Days: 29
Plan Fractions Treated to Date: 19
Plan Prescribed Dose Per Fraction: 2.5 Gy
Plan Total Fractions Prescribed: 28
Plan Total Prescribed Dose: 70 Gy
Reference Point Dosage Given to Date: 47.5 Gy
Reference Point Session Dosage Given: 2.5 Gy
Session Number: 19

## 2023-07-22 ENCOUNTER — Other Ambulatory Visit: Payer: Self-pay

## 2023-07-22 ENCOUNTER — Ambulatory Visit: Admission: RE | Admit: 2023-07-22 | Payer: 59 | Source: Ambulatory Visit

## 2023-07-22 DIAGNOSIS — C61 Malignant neoplasm of prostate: Secondary | ICD-10-CM | POA: Diagnosis not present

## 2023-07-22 LAB — RAD ONC ARIA SESSION SUMMARY
Course Elapsed Days: 30
Plan Fractions Treated to Date: 20
Plan Prescribed Dose Per Fraction: 2.5 Gy
Plan Total Fractions Prescribed: 28
Plan Total Prescribed Dose: 70 Gy
Reference Point Dosage Given to Date: 50 Gy
Reference Point Session Dosage Given: 2.5 Gy
Session Number: 20

## 2023-07-23 ENCOUNTER — Other Ambulatory Visit: Payer: Self-pay

## 2023-07-23 ENCOUNTER — Ambulatory Visit
Admission: RE | Admit: 2023-07-23 | Discharge: 2023-07-23 | Disposition: A | Discharge status: Home / Self Care | Payer: 59 | Source: Ambulatory Visit | Attending: Radiation Oncology | Admitting: Radiation Oncology

## 2023-07-23 DIAGNOSIS — C61 Malignant neoplasm of prostate: Secondary | ICD-10-CM | POA: Diagnosis not present

## 2023-07-23 LAB — RAD ONC ARIA SESSION SUMMARY
Course Elapsed Days: 31
Plan Fractions Treated to Date: 21
Plan Prescribed Dose Per Fraction: 2.5 Gy
Plan Total Fractions Prescribed: 28
Plan Total Prescribed Dose: 70 Gy
Reference Point Dosage Given to Date: 52.5 Gy
Reference Point Session Dosage Given: 2.5 Gy
Session Number: 21

## 2023-07-24 ENCOUNTER — Other Ambulatory Visit: Payer: Self-pay

## 2023-07-24 ENCOUNTER — Ambulatory Visit: Admission: RE | Admit: 2023-07-24 | Payer: 59 | Source: Ambulatory Visit

## 2023-07-24 ENCOUNTER — Ambulatory Visit
Admission: RE | Admit: 2023-07-24 | Discharge: 2023-07-24 | Disposition: A | Discharge status: Home / Self Care | Payer: 59 | Source: Ambulatory Visit | Attending: Radiation Oncology | Admitting: Radiation Oncology

## 2023-07-24 DIAGNOSIS — C61 Malignant neoplasm of prostate: Secondary | ICD-10-CM | POA: Diagnosis not present

## 2023-07-24 LAB — RAD ONC ARIA SESSION SUMMARY
Course Elapsed Days: 32
Plan Fractions Treated to Date: 22
Plan Prescribed Dose Per Fraction: 2.5 Gy
Plan Total Fractions Prescribed: 28
Plan Total Prescribed Dose: 70 Gy
Reference Point Dosage Given to Date: 55 Gy
Reference Point Session Dosage Given: 2.5 Gy
Session Number: 22

## 2023-07-27 ENCOUNTER — Other Ambulatory Visit: Payer: Self-pay

## 2023-07-27 ENCOUNTER — Ambulatory Visit
Admission: RE | Admit: 2023-07-27 | Discharge: 2023-07-27 | Disposition: A | Discharge status: Home / Self Care | Payer: 59 | Source: Ambulatory Visit | Attending: Radiation Oncology | Admitting: Radiation Oncology

## 2023-07-27 DIAGNOSIS — C61 Malignant neoplasm of prostate: Secondary | ICD-10-CM | POA: Diagnosis not present

## 2023-07-27 LAB — RAD ONC ARIA SESSION SUMMARY
Course Elapsed Days: 35
Plan Fractions Treated to Date: 23
Plan Prescribed Dose Per Fraction: 2.5 Gy
Plan Total Fractions Prescribed: 28
Plan Total Prescribed Dose: 70 Gy
Reference Point Dosage Given to Date: 57.5 Gy
Reference Point Session Dosage Given: 2.5 Gy
Session Number: 23

## 2023-07-28 ENCOUNTER — Other Ambulatory Visit: Payer: Self-pay

## 2023-07-28 ENCOUNTER — Ambulatory Visit
Admission: RE | Admit: 2023-07-28 | Discharge: 2023-07-28 | Disposition: A | Discharge status: Home / Self Care | Payer: 59 | Source: Ambulatory Visit | Attending: Radiation Oncology | Admitting: Radiation Oncology

## 2023-07-28 DIAGNOSIS — C61 Malignant neoplasm of prostate: Secondary | ICD-10-CM | POA: Diagnosis not present

## 2023-07-28 LAB — RAD ONC ARIA SESSION SUMMARY
Course Elapsed Days: 36
Plan Fractions Treated to Date: 24
Plan Prescribed Dose Per Fraction: 2.5 Gy
Plan Total Fractions Prescribed: 28
Plan Total Prescribed Dose: 70 Gy
Reference Point Dosage Given to Date: 60 Gy
Reference Point Session Dosage Given: 2.5 Gy
Session Number: 24

## 2023-07-29 ENCOUNTER — Other Ambulatory Visit: Payer: Self-pay

## 2023-07-29 ENCOUNTER — Ambulatory Visit
Admission: RE | Admit: 2023-07-29 | Discharge: 2023-07-29 | Disposition: A | Discharge status: Home / Self Care | Payer: 59 | Source: Ambulatory Visit | Attending: Radiation Oncology | Admitting: Radiation Oncology

## 2023-07-29 DIAGNOSIS — C61 Malignant neoplasm of prostate: Secondary | ICD-10-CM | POA: Diagnosis not present

## 2023-07-29 LAB — RAD ONC ARIA SESSION SUMMARY
Course Elapsed Days: 37
Plan Fractions Treated to Date: 25
Plan Prescribed Dose Per Fraction: 2.5 Gy
Plan Total Fractions Prescribed: 28
Plan Total Prescribed Dose: 70 Gy
Reference Point Dosage Given to Date: 62.5 Gy
Reference Point Session Dosage Given: 2.5 Gy
Session Number: 25

## 2023-07-30 ENCOUNTER — Ambulatory Visit: Payer: 59

## 2023-07-30 ENCOUNTER — Other Ambulatory Visit: Payer: Self-pay

## 2023-07-30 ENCOUNTER — Ambulatory Visit
Admission: RE | Admit: 2023-07-30 | Discharge: 2023-07-30 | Disposition: A | Discharge status: Home / Self Care | Payer: 59 | Source: Ambulatory Visit | Attending: Radiation Oncology | Admitting: Radiation Oncology

## 2023-07-30 DIAGNOSIS — C61 Malignant neoplasm of prostate: Secondary | ICD-10-CM | POA: Insufficient documentation

## 2023-07-30 DIAGNOSIS — Z510 Encounter for antineoplastic radiation therapy: Secondary | ICD-10-CM | POA: Diagnosis not present

## 2023-07-30 LAB — RAD ONC ARIA SESSION SUMMARY
Course Elapsed Days: 38
Plan Fractions Treated to Date: 26
Plan Prescribed Dose Per Fraction: 2.5 Gy
Plan Total Fractions Prescribed: 28
Plan Total Prescribed Dose: 70 Gy
Reference Point Dosage Given to Date: 65 Gy
Reference Point Session Dosage Given: 2.5 Gy
Session Number: 26

## 2023-07-31 ENCOUNTER — Ambulatory Visit: Payer: 59

## 2023-07-31 ENCOUNTER — Ambulatory Visit
Admission: RE | Admit: 2023-07-31 | Discharge: 2023-07-31 | Disposition: A | Discharge status: Home / Self Care | Payer: 59 | Source: Ambulatory Visit | Attending: Radiation Oncology | Admitting: Radiation Oncology

## 2023-07-31 ENCOUNTER — Other Ambulatory Visit: Payer: Self-pay

## 2023-07-31 ENCOUNTER — Ambulatory Visit: Admission: RE | Admit: 2023-07-31 | Payer: 59 | Source: Ambulatory Visit

## 2023-07-31 DIAGNOSIS — Z510 Encounter for antineoplastic radiation therapy: Secondary | ICD-10-CM | POA: Diagnosis not present

## 2023-07-31 LAB — RAD ONC ARIA SESSION SUMMARY
Course Elapsed Days: 39
Course Elapsed Days: 39
Plan Fractions Treated to Date: 27
Plan Fractions Treated to Date: 28
Plan Prescribed Dose Per Fraction: 2.5 Gy
Plan Prescribed Dose Per Fraction: 2.5 Gy
Plan Total Fractions Prescribed: 28
Plan Total Fractions Prescribed: 28
Plan Total Prescribed Dose: 70 Gy
Plan Total Prescribed Dose: 70 Gy
Reference Point Dosage Given to Date: 67.5 Gy
Reference Point Dosage Given to Date: 70 Gy
Reference Point Session Dosage Given: 2.5 Gy
Reference Point Session Dosage Given: 2.5 Gy
Session Number: 27
Session Number: 28

## 2023-08-03 ENCOUNTER — Ambulatory Visit: Payer: 59

## 2023-08-03 NOTE — Radiation Completion Notes (Addendum)
  Radiation Oncology         [phone] ________________________________  Name: Larry Martin MRN: [id number]  Date: 07/31/2023  DOB: 11-26-1956  Referring Physician: Bjorn Pippin, M.D. Date of Service: 2023-08-03 Radiation Oncologist: Margaretmary Bayley, M.D. [facility]     RADIATION ONCOLOGY END OF TREATMENT NOTE     Diagnosis: 67 y.o. gentleman with Stage T1c adenocarcinoma of the prostate with Gleason Score of 4+3, and PSA of 5.31.   Intent: Curative     ==========DELIVERED PLANS==========  First Treatment Date: 2023-06-22 - Last Treatment Date: 2023-07-31   Plan Name: Prostate Site: Prostate Technique: IMRT Mode: Photon Dose Per Fraction: 2.5 Gy Prescribed Dose (Delivered / Prescribed): 70 Gy / 70 Gy Prescribed Fxs (Delivered / Prescribed): 28 / 28     ==========ON TREATMENT VISIT DATES========== 2023-06-26, 2023-07-03, 2023-07-10, 2023-07-17, 2023-07-24, 2023-07-31   See weekly On Treatment Notes in Epic for details.  He tolerated the radiation treatment relatively well with some moderate urinary symptoms and modest fatigue. His LUTS were managed with Flomax, Myrbetriq and Hyoscamine for bladder spasms.  The patient will receive a call in about one month from the radiation oncology department. He will continue follow up with his urologist, Dr. Annabell Howells, as well.  ------------------------------------------------   Margaretmary Dys, MD [facility]  Radiation Oncology Direct Dial: [phone]  Fax: [phone] [email or web address]  Skype  LinkedIn

## 2023-08-10 NOTE — Progress Notes (Signed)
Patient was a RadOnc Consult on 04/14/23 for his stage T1c adenocarcinoma of the prostate with Gleason score of 4+3, and PSA of 5.31.  Patient proceed with treatment recommendations of 5.5 weeks of external beam therapy and had his final radiation treatment on 07/31/23.   Patient is scheduled for a post treatment nurse call on 09/15/23 and has a follow up at [facility] on 08/28/23.  RN spoke with patient's wife, Larry Martin, and provided education on post treatment PSA monitoring.  [name] aware that first PSA will be obtained approximately 3 months from final treatment.  She will coordinate for this at upcoming follow up.

## 2023-08-17 ENCOUNTER — Encounter: Payer: Self-pay | Admitting: Nurse Practitioner

## 2023-08-17 ENCOUNTER — Ambulatory Visit (INDEPENDENT_AMBULATORY_CARE_PROVIDER_SITE_OTHER): Payer: 59 | Admitting: Nurse Practitioner

## 2023-08-17 VITALS — BP 146/84 | HR 98 | Temp 98.4°F | Ht 66.0 in | Wt 182.0 lb

## 2023-08-17 DIAGNOSIS — E7801 Familial hypercholesterolemia: Secondary | ICD-10-CM | POA: Diagnosis not present

## 2023-08-17 DIAGNOSIS — I10 Essential (primary) hypertension: Secondary | ICD-10-CM | POA: Diagnosis not present

## 2023-08-17 DIAGNOSIS — Z87891 Personal history of nicotine dependence: Secondary | ICD-10-CM

## 2023-08-17 DIAGNOSIS — E559 Vitamin D deficiency, unspecified: Secondary | ICD-10-CM | POA: Diagnosis not present

## 2023-08-17 DIAGNOSIS — R7309 Other abnormal glucose: Secondary | ICD-10-CM | POA: Diagnosis not present

## 2023-08-17 DIAGNOSIS — C61 Malignant neoplasm of prostate: Secondary | ICD-10-CM

## 2023-08-17 DIAGNOSIS — E663 Overweight: Secondary | ICD-10-CM

## 2023-08-17 NOTE — Assessment & Plan Note (Signed)
Blood pressure is elevated, he has not taken his medications today. Advised to take his medications regularly.

## 2023-08-17 NOTE — Progress Notes (Signed)
Madelaine Bhat, CMA,acting as a Neurosurgeon for Arnette Felts, FNP.,have documented all relevant documentation on the behalf of Arnette Felts, FNP,as directed by  Arnette Felts, FNP while in the presence of Arnette Felts, FNP.  Subjective:  Patient ID: Larry Martin , male    DOB: 05-20-1956 , 67 y.o.   MRN: [id number]  Chief Complaint  Patient presents with   Hypertension    HPI  Patient presents today for a BP and Chol follow up.  Patient denies any chest pain, SOB, or headaches. Patient has no other concerns today. Patient reports he didn't take his medications today.  He has completed his treatment for prostate cancer. He feels better since completing his treatment. He has only smoked two cigarettes today. He was in a smoking cessation program, had stopped for 3 months. He did not smoke this weekend since he was with family.   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   BP Readings from Last 3 Encounters: 08/17/23 : (!) 146/84 07/11/23 : (!) 155/113 06/02/23 : (!) 151/98       Past Medical History:  Diagnosis Date   BPH with obstruction/lower urinary tract symptoms    Chronic idiopathic constipation    ED (erectile dysfunction)    GERD (gastroesophageal reflux disease)    History of Helicobacter pylori infection 2019   treated   HLD (hyperlipidemia)    Hypertension    in epic -- 12-25-2014  nuclear stress study -- low risk, nuclear ef 62%;  echo ef 50-55%, G2DD, mild TR   Iron deficiency    Malignant neoplasm prostate (HCC) 01/2023   urologist-- dr wrenn/  radiation onologist--- dr Kathrynn Running;  dx 02/ 2024, gleason 4+3   Vitamin D deficiency    Weak urine stream      Family History  Problem Relation Age of Onset   Blindness Mother        not leagally blind   Breast cancer Daughter    Colon cancer Neg Hx    Stomach cancer Neg Hx    Esophageal cancer Neg Hx    Inflammatory bowel disease Neg Hx    Liver disease Neg Hx    Pancreatic cancer Neg Hx    Rectal cancer Neg Hx       Current Outpatient Medications:    amLODipine (NORVASC) 5 MG tablet, Take 1 tablet (5 mg total) by mouth daily. (Patient taking differently: Take 5 mg by mouth daily.), Disp: 90 tablet, Rfl: 1   atorvastatin (LIPITOR) 10 MG tablet, Take 1 tablet (10 mg total) by mouth daily. (Patient taking differently: Take 10 mg by mouth at bedtime.), Disp: 90 tablet, Rfl: 1   Bisacodyl (LAXATIVE PO), Take by mouth as needed., Disp: , Rfl:    hyoscyamine (LEVSIN/SL) 0.125 MG SL tablet, Place 1 tablet (0.125 mg total) under the tongue every 4 (four) hours as needed., Disp: , Rfl:    mirabegron ER (MYRBETRIQ) 25 MG TB24 tablet, Take 1 tablet (25 mg total) by mouth daily. If still having increased frequency and urgency after 1 week, can increase to 2 tablets daily, Disp: 30 tablet, Rfl: 2   omeprazole (PRILOSEC) 40 MG capsule, Take 1 capsule (40 mg total) by mouth 2 (two) times daily. (Patient taking differently: Take 40 mg by mouth 2 (two) times daily as needed.), Disp: 90 capsule, Rfl: 1   Polyethyl Glycol-Propyl Glycol (SYSTANE OP), Apply to eye as needed (dry eyes)., Disp: , Rfl:    Probiotic Product (RESTORA) CAPS, Take by  mouth as needed., Disp: , Rfl:    Simethicone (GAS-X PO), Take by mouth as needed., Disp: , Rfl:    tamsulosin (FLOMAX) 0.4 MG CAPS capsule, Take 1 capsule (0.4 mg total) by mouth daily., Disp: 30 capsule, Rfl: 1   No Known Allergies   Review of Systems  Constitutional: Negative.   Respiratory: Negative.    Cardiovascular: Negative.   Genitourinary:        Was seen in ER in July due to urinary retention  Psychiatric/Behavioral: Negative.       Today's Vitals   08/17/23 1441  BP: (!) 146/84  Pulse: 98  Temp: 98.4 F (36.9 C)  TempSrc: Oral  Weight: 182 lb (82.6 kg)  Height: 5\' 6"  (1.676 m)  PainSc: 0-No pain   Body mass index is 29.38 kg/m.  Wt Readings from Last 3 Encounters:  08/17/23 182 lb (82.6 kg)  07/11/23 184 lb (83.5 kg)  06/02/23 186 lb 12.8 oz (84.7 kg)     The 10-year ASCVD risk score (Arnett DK, et al., 2019) is: 30.1%   Values used to calculate the score:     Age: 10 years     Sex: Male     Is Non-Hispanic African American: Yes     Diabetic: No     Tobacco smoker: Yes     Systolic Blood Pressure: 146 mmHg     Is BP treated: Yes     HDL Cholesterol: 43 mg/dL     Total Cholesterol: 146 mg/dL  Objective:  Physical Exam Vitals reviewed.  Constitutional:      General: He is not in acute distress.    Appearance: Normal appearance.  Cardiovascular:     Rate and Rhythm: Normal rate and regular rhythm.     Pulses: Normal pulses.     Heart sounds: Normal heart sounds. No murmur heard. Pulmonary:     Effort: Pulmonary effort is normal. No respiratory distress.     Breath sounds: Normal breath sounds. No wheezing.  Musculoskeletal:        General: No swelling or tenderness.  Skin:    General: Skin is warm and dry.     Capillary Refill: Capillary refill takes less than 2 seconds.  Neurological:     General: No focal deficit present.     Mental Status: He is alert and oriented to person, place, and time.     Cranial Nerves: No cranial nerve deficit.     Motor: No weakness.  Psychiatric:        Mood and Affect: Mood normal.        Behavior: Behavior normal.        Thought Content: Thought content normal.        Judgment: Judgment normal.         Assessment And Plan:  Primary hypertension Assessment & Plan: Blood pressure is elevated, he has not taken his medications today. Advised to take his medications regularly.    Abnormal glucose Assessment & Plan: He did not get his HgbA1c done at last visit. No current medications  Orders: -     Hemoglobin A1c  Familial hypercholesterolemia Assessment & Plan: Cholesterol levels are stable. Continue low fat diet  Orders: -     Lipid panel  Vitamin D deficiency Assessment & Plan: Will check vitamin D level and supplement as needed.    Also encouraged to spend 15 minutes  in the sun daily.    Orders: -     VITAMIN D 25  Hydroxy (Vit-D Deficiency, Fractures)  Malignant neoplasm of prostate [facility]) Assessment & Plan: He has completed his treatments and is doing well.    History of smoking 10-25 pack years Assessment & Plan: Will send order for low dose CT scan lung  Orders: -     CT CHEST LUNG CANCER SCREENING LOW DOSE WO CONTRAST; Future  Overweight with body mass index (BMI) 25.0-29.9    No follow-ups on file.  Patient was given opportunity to ask questions. Patient verbalized understanding of the plan and was able to repeat key elements of the plan. All questions were answered to their satisfaction.    Jeanell Sparrow, FNP, have reviewed all documentation for this visit. The documentation on 08/17/23 for the exam, diagnosis, procedures, and orders are all accurate and complete.   IF YOU HAVE BEEN REFERRED TO A SPECIALIST, IT MAY TAKE 1-2 WEEKS TO SCHEDULE/PROCESS THE REFERRAL. IF YOU HAVE NOT HEARD FROM US/SPECIALIST IN TWO WEEKS, PLEASE GIVE [place] A CALL AT [phone] X [phone].

## 2023-08-17 NOTE — Assessment & Plan Note (Signed)
He did not get his HgbA1c done at last visit. No current medications

## 2023-08-17 NOTE — Assessment & Plan Note (Signed)
He has completed his treatments and is doing well.

## 2023-08-18 LAB — LIPID PANEL
Chol/HDL Ratio: 3.4 ratio (ref 0.0–5.0)
Cholesterol, Total: 146 mg/dL (ref 100–199)
HDL: 43 mg/dL (ref >39)
LDL Chol Calc (NIH): 71 mg/dL (ref 0–99)
Triglycerides: 192 mg/dL — ABNORMAL HIGH (ref 0–149)
VLDL Cholesterol Cal: 32 mg/dL (ref 5–40)

## 2023-08-18 LAB — VITAMIN D 25 HYDROXY (VIT D DEFICIENCY, FRACTURES): Vit D, 25-Hydroxy: 27.6 ng/mL — ABNORMAL LOW (ref 30.0–100.0)

## 2023-08-18 LAB — HEMOGLOBIN A1C
Est. average glucose Bld gHb Est-mCnc: 120 mg/dL
Hgb A1c MFr Bld: 5.8 % — ABNORMAL HIGH (ref 4.8–5.6)

## 2023-08-24 MED ORDER — VITAMIN D (ERGOCALCIFEROL) 1.25 MG (50000 UNIT) PO CAPS: 50000.0000 [IU] | ORAL_CAPSULE | ORAL | 1 refills | Status: DC

## 2023-08-24 NOTE — Assessment & Plan Note (Signed)
Cholesterol levels are stable. Continue low fat diet

## 2023-08-24 NOTE — Assessment & Plan Note (Signed)
Will send order for low dose CT scan lung

## 2023-08-24 NOTE — Assessment & Plan Note (Signed)
Will check vitamin D level and supplement as needed.    Also encouraged to spend 15 minutes in the sun daily.   

## 2023-09-02 ENCOUNTER — Encounter: Payer: Self-pay | Admitting: Nurse Practitioner

## 2023-09-04 ENCOUNTER — Encounter: Payer: Self-pay | Admitting: *Deleted

## 2023-09-04 ENCOUNTER — Other Ambulatory Visit: Payer: Self-pay | Admitting: Urology

## 2023-09-04 DIAGNOSIS — C61 Malignant neoplasm of prostate: Secondary | ICD-10-CM

## 2023-09-11 NOTE — Progress Notes (Addendum)
Radiation Oncology         [phone]) [phone] ________________________________  Name: Larry Martin MRN: [id number]  Date of Service: 09/11/2023  DOB: 1956/12/12  Post Treatment Telephone Note  Diagnosis:  67 y.o. gentleman with Stage T1c adenocarcinoma of the prostate with Gleason Score of 4+3, and PSA of 5.31. (as documented in provider EOT note)  Pre Treatment IPSS Score: 1 (as documented in the provider consult note)  The patient was available for call today.   Symptoms of fatigue have improved since completing therapy.  Symptoms of bladder changes have improved since completing therapy. Current symptoms include urinary urgency, and medications for bladder symptoms include none.  Symptoms of bowel changes have not improved since completing therapy. Current symptoms include none, and medications for bowel symptoms include none.   Post Treatment IPSS Score: IPSS Questionnaire (AUA-7): Over the past month.   1)  How often have you had a sensation of not emptying your bladder completely after you finish urinating?  0 - Not at all  2)  How often have you had to urinate again less than two hours after you finished urinating? 1 - Less than 1 time in 5  3)  How often have you found you stopped and started again several times when you urinated?  0 - Not at all  4) How difficult have you found it to postpone urination?  5 - Almost always  5) How often have you had a weak urinary stream?  5 - Almost always  6) How often have you had to push or strain to begin urination?  3 - About half the time  7) How many times did you most typically get up to urinate from the time you went to bed until the time you got up in the morning?  2 - 2 times  Total score:  16. Which indicates moderate symptoms  0-7 mildly symptomatic   8-19 moderately symptomatic   20-35 severely symptomatic    Patient has a scheduled follow up visit with his urologist, Dr. Annabell Howells, on 11/2023  for ongoing surveillance. He was  counseled that PSA levels will be drawn in the urology office, and was reassured that additional time is expected to improve bowel and bladder symptoms. He was encouraged to call back with concerns or questions regarding radiation.   This concludes the interaction.  Ruel Favors, LPN

## 2023-09-15 ENCOUNTER — Ambulatory Visit
Admission: RE | Admit: 2023-09-15 | Discharge: 2023-09-15 | Disposition: A | Discharge status: Home / Self Care | Payer: 59 | Source: Ambulatory Visit | Attending: Radiation Oncology | Admitting: Radiation Oncology

## 2023-09-22 ENCOUNTER — Encounter: Payer: Self-pay | Admitting: *Deleted

## 2023-09-22 NOTE — Progress Notes (Signed)
Called [facility] to inquire information that I need to finish pt's care plan. I left a message with [facility] answering service.

## 2023-09-25 ENCOUNTER — Encounter: Payer: Self-pay | Admitting: *Deleted

## 2023-09-25 ENCOUNTER — Inpatient Hospital Stay: Payer: 59 | Attending: Adult Health | Admitting: *Deleted

## 2023-09-25 DIAGNOSIS — C61 Malignant neoplasm of prostate: Secondary | ICD-10-CM

## 2023-09-25 NOTE — Progress Notes (Signed)
  SCP reviewed and completed. Pt will get post treatment PSA labs Dec.27 at [facility].

## 2023-09-27 ENCOUNTER — Other Ambulatory Visit: Payer: Self-pay

## 2023-09-27 ENCOUNTER — Emergency Department (HOSPITAL_COMMUNITY)
Admission: EM | Admit: 2023-09-27 | Discharge: 2023-09-27 | Disposition: A | Discharge status: Home / Self Care | Payer: 59 | Attending: Emergency Medicine | Admitting: Emergency Medicine

## 2023-09-27 ENCOUNTER — Emergency Department (HOSPITAL_COMMUNITY): Payer: 59

## 2023-09-27 ENCOUNTER — Encounter (HOSPITAL_COMMUNITY): Payer: Self-pay

## 2023-09-27 DIAGNOSIS — I10 Essential (primary) hypertension: Secondary | ICD-10-CM | POA: Insufficient documentation

## 2023-09-27 DIAGNOSIS — Z8546 Personal history of malignant neoplasm of prostate: Secondary | ICD-10-CM | POA: Insufficient documentation

## 2023-09-27 DIAGNOSIS — N200 Calculus of kidney: Secondary | ICD-10-CM | POA: Insufficient documentation

## 2023-09-27 DIAGNOSIS — R109 Unspecified abdominal pain: Secondary | ICD-10-CM | POA: Diagnosis present

## 2023-09-27 DIAGNOSIS — Z79899 Other long term (current) drug therapy: Secondary | ICD-10-CM | POA: Insufficient documentation

## 2023-09-27 LAB — URINALYSIS, ROUTINE W REFLEX MICROSCOPIC
Bilirubin Urine: NEGATIVE
Glucose, UA: NEGATIVE mg/dL
Ketones, ur: NEGATIVE mg/dL
Leukocytes,Ua: NEGATIVE
Nitrite: NEGATIVE
Protein, ur: NEGATIVE mg/dL
RBC / HPF: >50 RBC/hpf (ref 0–5)
Specific Gravity, Urine: 1.012 (ref 1.005–1.030)
pH: 5 (ref 5.0–8.0)

## 2023-09-27 LAB — COMPREHENSIVE METABOLIC PANEL
ALT: 21 U/L (ref 0–44)
AST: 31 U/L (ref 15–41)
Albumin: 3.6 g/dL (ref 3.5–5.0)
Alkaline Phosphatase: 26 U/L — ABNORMAL LOW (ref 38–126)
Anion gap: 13 (ref 5–15)
BUN: 13 mg/dL (ref 8–23)
CO2: 20 mmol/L — ABNORMAL LOW (ref 22–32)
Calcium: 8.6 mg/dL — ABNORMAL LOW (ref 8.9–10.3)
Chloride: 105 mmol/L (ref 98–111)
Creatinine, Ser: 1.7 mg/dL — ABNORMAL HIGH (ref 0.61–1.24)
GFR, Estimated: 44 mL/min — ABNORMAL LOW (ref >60)
Glucose, Bld: 190 mg/dL — ABNORMAL HIGH (ref 70–99)
Potassium: 4.3 mmol/L (ref 3.5–5.1)
Sodium: 138 mmol/L (ref 135–145)
Total Bilirubin: 0.7 mg/dL (ref 0.3–1.2)
Total Protein: 6 g/dL — ABNORMAL LOW (ref 6.5–8.1)

## 2023-09-27 LAB — CBC
HCT: 45.2 % (ref 39.0–52.0)
Hemoglobin: 13.8 g/dL (ref 13.0–17.0)
MCH: 24.2 pg — ABNORMAL LOW (ref 26.0–34.0)
MCHC: 30.5 g/dL (ref 30.0–36.0)
MCV: 79.3 fL — ABNORMAL LOW (ref 80.0–100.0)
Platelets: 166 10*3/uL (ref 150–400)
RBC: 5.7 MIL/uL (ref 4.22–5.81)
RDW: 13.4 % (ref 11.5–15.5)
WBC: 8.2 10*3/uL (ref 4.0–10.5)
nRBC: 0 % (ref 0.0–0.2)

## 2023-09-27 MED ORDER — ONDANSETRON HCL 4 MG PO TABS: 4.0000 mg | ORAL_TABLET | Freq: Four times a day (QID) | ORAL | 0 refills | Status: AC | PRN

## 2023-09-27 MED ORDER — HYDROCODONE-ACETAMINOPHEN 5-325 MG PO TABS
1.0000 | ORAL_TABLET | Freq: Four times a day (QID) | ORAL | 0 refills | Status: DC | PRN
Start: 2023-09-27 — End: 2023-09-27

## 2023-09-27 MED ORDER — TAMSULOSIN HCL 0.4 MG PO CAPS: 0.4000 mg | ORAL_CAPSULE | Freq: Every day | ORAL | 0 refills | Status: AC

## 2023-09-27 MED ORDER — HYDROCODONE-ACETAMINOPHEN 5-325 MG PO TABS
1.0000 | ORAL_TABLET | Freq: Four times a day (QID) | ORAL | 0 refills | Status: AC | PRN
Start: 2023-09-27 — End: ?

## 2023-09-27 MED ORDER — SODIUM CHLORIDE 0.9 % IV BOLUS
1000.0000 mL | Freq: Once | INTRAVENOUS | Status: AC
  Administered 2023-09-27: 1000 mL via INTRAVENOUS

## 2023-09-27 NOTE — ED Notes (Signed)
Patient transported to CT 

## 2023-09-27 NOTE — ED Triage Notes (Signed)
Pt arrives via ems to the er for the c/o left sided flank pain that travels to his left abdomen. Pt states it started last night, drank some hot tea, vomit x 1 at home with small relief after vomiting. Ems administered 4mg  zofran and fentanyl. Pt states pain currently 8/10. Hx prostate cancer

## 2023-09-27 NOTE — Discharge Instructions (Addendum)
It was a pleasure taking part in your care today.  As we discussed it appears as if you recently passed a kidney stone this morning.  It is currently in your bladder.  You will need to urinate the stone out for to be completely passed.  I am sending you home with hydrocodone pain medication to help with pain, antinausea medication called Zofran, tamsulosin which will help to relax your smooth muscles of your urinary tract to allow the stone to pass.  You will need to follow-up with urology at your earliest convenience.  Please continue to push fluids and Renette often.  Please return to the ED with any new or worsening signs or symptoms.

## 2023-09-27 NOTE — ED Provider Notes (Signed)
EMERGENCY DEPARTMENT AT [facility] Provider Note   CSN: [id number] Arrival date & time: 09/27/23  9562     History  Chief Complaint  Patient presents with   Flank Pain    Larry Martin is a 67 y.o. male with medical history of prostate cancer no longer on radiation therapy, BPH, chronic constipation, ED, GERD, hyperlipidemia, hypertension.  Patient presents to ED for evaluation of left flank pain.  The patient reports that last night around 1 AM he woke up with severe left-sided flank pain rated 10 out of 10.  He reports that he believes it might of been some gas so he went to the kitchen and drank some tea.  Patient states that about an hour later he had a sudden onset of nausea and vomiting and threw up 1 time.  He reports that a few hours later he was unable to become comfortable so he called 911.  He reports that the flank pain will wrap from his left flank into his right lower groin.  He denies any testicle pain, penile discharge.  He is endorsing slight dysuria along with nausea and vomiting.  States last bowel movement was today.  Denies chest pain or shortness of breath or fevers.  Denies history of kidney stones.   Flank Pain Pertinent negatives include no chest pain and no shortness of breath.       Home Medications Prior to Admission medications   Medication Sig Start Date End Date Taking? Authorizing Provider  ondansetron (ZOFRAN) 4 MG tablet Take 1 tablet (4 mg total) by mouth every 6 (six) hours as needed for nausea or vomiting. 09/27/23  Yes Al Decant, PA-C  tamsulosin (FLOMAX) 0.4 MG CAPS capsule Take 1 capsule (0.4 mg total) by mouth daily after breakfast. 09/27/23  Yes Al Decant, PA-C  amLODipine (NORVASC) 5 MG tablet Take 1 tablet (5 mg total) by mouth daily. Patient taking differently: Take 5 mg by mouth daily. 04/16/23   Arnette Felts, FNP  atorvastatin (LIPITOR) 10 MG tablet Take 1 tablet (10 mg total) by mouth  daily. Patient taking differently: Take 10 mg by mouth at bedtime. 04/16/23   Arnette Felts, FNP  HYDROcodone-acetaminophen (NORCO/VICODIN) 5-325 MG tablet Take 1 tablet by mouth every 6 (six) hours as needed for severe pain. 09/27/23   Al Decant, PA-C  hyoscyamine (LEVSIN/SL) 0.125 MG SL tablet Place 1 tablet (0.125 mg total) under the tongue every 4 (four) hours as needed. Patient not taking: Reported on 09/25/2023 07/16/23   Despina Arias, MD  omeprazole (PRILOSEC) 40 MG capsule Take 1 capsule (40 mg total) by mouth 2 (two) times daily. Patient taking differently: Take 40 mg by mouth 2 (two) times daily as needed. 04/15/22   Arnette Felts, FNP  Polyethyl Glycol-Propyl Glycol (SYSTANE OP) Apply to eye as needed (dry eyes).    [provider]  Probiotic Product (RESTORA) CAPS Take by mouth as needed. Patient not taking: Reported on 09/25/2023    [provider]  Vitamin D, Ergocalciferol, (DRISDOL) 1.25 MG (50000 UNIT) CAPS capsule Take 1 capsule (50,000 Units total) by mouth every 7 (seven) days. 08/24/23   Arnette Felts, FNP      Allergies    Patient has no known allergies.    Review of Systems   Review of Systems  Constitutional:  Negative for fever.  Respiratory:  Negative for shortness of breath.   Cardiovascular:  Negative for chest pain.  Gastrointestinal:  Positive for  nausea and vomiting.  Genitourinary:  Positive for dysuria and flank pain. Negative for testicular pain.  All other systems reviewed and are negative.   Physical Exam Updated Vital Signs BP (!) 153/93 (BP Location: Left Arm)   Pulse 88   Temp 98.1 F (36.7 C) (Oral)   Resp 18   Ht 5\' 6"  (1.676 m)   Wt 77.6 kg   SpO2 99%   BMI 27.60 kg/m  Physical Exam Vitals and nursing note reviewed.  Constitutional:      General: He is not in acute distress.    Appearance: Normal appearance. He is not ill-appearing, toxic-appearing or diaphoretic.  HENT:     Head: Normocephalic and  atraumatic.     Nose: Nose normal.     Mouth/Throat:     Mouth: Mucous membranes are moist.     Pharynx: Oropharynx is clear.  Eyes:     Extraocular Movements: Extraocular movements intact.     Conjunctiva/sclera: Conjunctivae normal.     Pupils: Pupils are equal, round, and reactive to light.  Cardiovascular:     Rate and Rhythm: Normal rate and regular rhythm.  Pulmonary:     Effort: Pulmonary effort is normal.     Breath sounds: Normal breath sounds. No wheezing.  Abdominal:     General: Abdomen is flat. Bowel sounds are normal.     Palpations: Abdomen is soft.     Tenderness: There is no abdominal tenderness. There is no right CVA tenderness or left CVA tenderness.  Musculoskeletal:     Cervical back: Normal range of motion and neck supple. No tenderness.  Skin:    General: Skin is warm and dry.     Capillary Refill: Capillary refill takes less than 2 seconds.  Neurological:     Mental Status: He is alert and oriented to person, place, and time.     ED Results / Procedures / Treatments   Labs (all labs ordered are listed, but only abnormal results are displayed) Labs Reviewed  URINALYSIS, ROUTINE W REFLEX MICROSCOPIC - Abnormal; Notable for the following components:      Result Value   Hgb urine dipstick LARGE (*)    Bacteria, UA RARE (*)    All other components within normal limits  COMPREHENSIVE METABOLIC PANEL - Abnormal; Notable for the following components:   CO2 20 (*)    Glucose, Bld 190 (*)    Creatinine, Ser 1.70 (*)    Calcium 8.6 (*)    Total Protein 6.0 (*)    Alkaline Phosphatase 26 (*)    GFR, Estimated 44 (*)    All other components within normal limits  CBC - Abnormal; Notable for the following components:   MCV 79.3 (*)    MCH 24.2 (*)    All other components within normal limits    EKG None  Radiology CT Renal Stone Study  Result Date: 09/27/2023 CLINICAL DATA:  Abdominal pain.  Flank pain.  LEFT-sided flank pain. EXAM: CT ABDOMEN AND  PELVIS WITHOUT CONTRAST TECHNIQUE: Multidetector CT imaging of the abdomen and pelvis was performed following the standard protocol without IV contrast. RADIATION DOSE REDUCTION: This exam was performed according to the departmental dose-optimization program which includes automated exposure control, adjustment of the mA and/or kV according to patient size and/or use of iterative reconstruction technique. COMPARISON:  None Available. FINDINGS: Lower chest: Lung bases are clear. Hepatobiliary: No focal hepatic lesion. Normal gallbladder. No biliary duct dilatation. Common bile duct is normal. Pancreas: Pancreas is normal.  No ductal dilatation. No pancreatic inflammation. Spleen: Normal spleen Adrenals/urinary tract: Adrenal glands normal. Perinephric stranding on the LEFT. LEFT renal edema. LEFT ureter is mildly dilated. There is stranding within the retroperitoneal fat along the course of LEFT ureter. No LEFT renal calculus is present. 2 mm calcification is present in the inferior aspect of the bladder (image 77/3) this is the junction of the bladder wall and the anterior wall of the prostate gland but position is favored within the inferior dependent portion of the bladder. RIGHT kidney normal. Stomach/Bowel: Stomach, small bowel, appendix, and cecum are normal. The colon and rectosigmoid colon are normal. Vascular/Lymphatic: Abdominal aorta is normal caliber. No periportal or retroperitoneal adenopathy. No pelvic adenopathy. Reproductive: There is dense material between the posterior wall of the rectum and posterior wall of the prostate gland. This material measures 3 cm and is favored gel related to prostate radiation therapy Other: No free fluid. Musculoskeletal: No aggressive osseous lesion. IMPRESSION: 1. LEFT renal edema and mild LEFT ureteral dilatation. No LEFT renal calculus. 2. Stranding in the retroperitoneal fat along the course of the LEFT ureter. Findings are favored to represent a recently passed  LEFT ureteral calculus. 3. A 2 mm calcification in the inferior aspect of the bladder is favored within the bladder lumen. 4. Dense material between the posterior wall of the rectum and posterior wall of the prostate gland is favored gel related to prostate radiation therapy. Electronically Signed   By: Genevive Bi M.D.   On: 09/27/2023 08:27    Procedures Procedures   Medications Ordered in ED Medications  sodium chloride 0.9 % bolus 1,000 mL (1,000 mLs Intravenous New Bag/Given 09/27/23 0734)    ED Course/ Medical Decision Making/ A&P  Medical Decision Making Amount and/or Complexity of Data Reviewed Labs: ordered. Radiology: ordered.   67 year old presents to the ED for evaluation of left-sided flank pain dysuria.  Please see HPI for further details.  On examination the patient is afebrile and nontachycardic.  Lung sounds are clear bilaterally, he is nonhypoxic.  Abdomen soft and compressible throughout with no CVA tenderness bilaterally.  He is overall toxic appearance.  He rates his pain 7 out of 10 currently.  Patient CBC shows no leukocytosis, no anemia.  Metabolic panel shows baseline creatinine 1.7, anion gap 13, no electrolyte derangement, no elevated LFTs.  Urinalysis shows large hemoglobin and rare bacteria.  CT renal stone study shows recently passed left-sided ureteral stone measured at 2 mm currently in bladder.  Patient most likely passed stone this morning when he had symptoms.  Patient currently symptom-free and pain-free at this time.  Will send patient home with urine strainer, hydrocodone pain medication, antinausea medication and expulsive therapy.  Have advised patient he will need to follow-up with urology, he states he is already established with urology because of his history of prostate cancer.  Have advised patient to continue pushing fluids and return to the ED with any new or worsening symptoms.  He voiced understanding.  Stable to discharge home.   Final  Clinical Impression(s) / ED Diagnoses Final diagnoses:  Kidney stone    Rx / DC Orders ED Discharge Orders          Ordered    HYDROcodone-acetaminophen (NORCO/VICODIN) 5-325 MG tablet  Every 6 hours PRN,   Status:  Discontinued        09/27/23 1012    ondansetron (ZOFRAN) 4 MG tablet  Every 6 hours PRN        09/27/23 1012  tamsulosin (FLOMAX) 0.4 MG CAPS capsule  Daily after breakfast        09/27/23 1012    HYDROcodone-acetaminophen (NORCO/VICODIN) 5-325 MG tablet  Every 6 hours PRN        09/27/23 1012              Al Decant, [place] 09/27/23 1014    Jacalyn Lefevre, MD 09/27/23 1601

## 2023-09-30 ENCOUNTER — Encounter: Payer: Self-pay | Admitting: Nurse Practitioner

## 2023-10-02 ENCOUNTER — Ambulatory Visit
Admission: RE | Admit: 2023-10-02 | Discharge: 2023-10-02 | Disposition: A | Discharge status: Home / Self Care | Payer: 59 | Source: Ambulatory Visit | Attending: Nurse Practitioner | Admitting: Nurse Practitioner

## 2023-10-02 DIAGNOSIS — Z87891 Personal history of nicotine dependence: Secondary | ICD-10-CM

## 2023-11-02 ENCOUNTER — Ambulatory Visit: Payer: 59 | Admitting: Nurse Practitioner

## 2023-11-02 ENCOUNTER — Encounter: Payer: Self-pay | Admitting: Nurse Practitioner

## 2023-11-02 VITALS — BP 120/80 | HR 81 | Temp 98.0°F | Ht 66.0 in | Wt 184.6 lb

## 2023-11-02 DIAGNOSIS — I10 Essential (primary) hypertension: Secondary | ICD-10-CM | POA: Diagnosis not present

## 2023-11-02 DIAGNOSIS — K219 Gastro-esophageal reflux disease without esophagitis: Secondary | ICD-10-CM

## 2023-11-02 DIAGNOSIS — Z79899 Other long term (current) drug therapy: Secondary | ICD-10-CM

## 2023-11-02 DIAGNOSIS — E663 Overweight: Secondary | ICD-10-CM | POA: Diagnosis not present

## 2023-11-02 DIAGNOSIS — Z0000 Encounter for general adult medical examination without abnormal findings: Secondary | ICD-10-CM | POA: Insufficient documentation

## 2023-11-02 DIAGNOSIS — E559 Vitamin D deficiency, unspecified: Secondary | ICD-10-CM

## 2023-11-02 DIAGNOSIS — E7801 Familial hypercholesterolemia: Secondary | ICD-10-CM | POA: Diagnosis not present

## 2023-11-02 DIAGNOSIS — R7309 Other abnormal glucose: Secondary | ICD-10-CM

## 2023-11-02 DIAGNOSIS — Z2821 Immunization not carried out because of patient refusal: Secondary | ICD-10-CM

## 2023-11-02 LAB — POCT URINALYSIS DIP (CLINITEK)
Bilirubin, UA: NEGATIVE
Blood, UA: NEGATIVE
Glucose, UA: NEGATIVE mg/dL
Ketones, POC UA: NEGATIVE mg/dL
Leukocytes, UA: NEGATIVE
Nitrite, UA: NEGATIVE
POC PROTEIN,UA: NEGATIVE
Spec Grav, UA: 1.02 (ref 1.010–1.025)
Urobilinogen, UA: 0.2 U/dL
pH, UA: 7 (ref 5.0–8.0)

## 2023-11-02 MED ORDER — AMLODIPINE BESYLATE 5 MG PO TABS
5.0000 mg | ORAL_TABLET | Freq: Every day | ORAL | 1 refills | Status: DC
Start: 2023-11-02 — End: 2024-05-26

## 2023-11-02 MED ORDER — OMEPRAZOLE 40 MG PO CPDR: 40.0000 mg | DELAYED_RELEASE_CAPSULE | Freq: Every day | ORAL | 1 refills | Status: AC

## 2023-11-02 MED ORDER — ATORVASTATIN CALCIUM 10 MG PO TABS
10.0000 mg | ORAL_TABLET | Freq: Every day | ORAL | 1 refills | Status: DC
Start: 2023-11-02 — End: 2024-05-26

## 2023-11-02 NOTE — Assessment & Plan Note (Signed)
Will check vitamin D level and supplement as needed.    Also encouraged to spend 15 minutes in the sun daily.   

## 2023-11-02 NOTE — Assessment & Plan Note (Signed)
Last HgbA1c slightly increased at 5.8, continue focusing on healthy diet low in sugar and starches.  No current medications

## 2023-11-02 NOTE — Assessment & Plan Note (Signed)
Blood pressure is slightly elevated diastolic, focus on lifestyle modifications and taking medications as directed.

## 2023-11-02 NOTE — Patient Instructions (Signed)
Health Maintenance  Topic Date Due   COVID-19 Vaccine (4 - 2023-24 season) 11/18/2023*   Flu Shot  03/28/2024*   Pneumonia Vaccine (1 of 2 - PCV) 11/01/2024*   Colon Cancer Screening  10/12/2028   DTaP/Tdap/Td vaccine (2 - Td or Tdap) 10/03/2031   Hepatitis C Screening  Completed   HIV Screening  Completed   HPV Vaccine  Aged Out   Zoster (Shingles) Vaccine  Discontinued  *Topic was postponed. The date shown is not the original due date.

## 2023-11-02 NOTE — Assessment & Plan Note (Addendum)
Behavior modifications discussed and diet history reviewed.   Pt will continue to exercise regularly and modify diet with low GI, plant based foods and decrease intake of processed foods.  Recommend intake of daily multivitamin, Vitamin D, and calcium.  Recommend mammogram and colonoscopy for preventive screenings, as well as recommend immunizations that include influenza, TDAP, and Covid (declined - had side effects)

## 2023-11-02 NOTE — Assessment & Plan Note (Signed)
Cholesterol levels are normal. Continue low fat diet and statin, tolerating well

## 2023-11-02 NOTE — Assessment & Plan Note (Signed)
Continue omeprazole daily, doing well

## 2023-11-02 NOTE — Assessment & Plan Note (Signed)

## 2023-11-02 NOTE — Progress Notes (Signed)
Madelaine Bhat, CMA,acting as a Neurosurgeon for Arnette Felts, FNP.,have documented all relevant documentation on the behalf of Arnette Felts, FNP,as directed by  Arnette Felts, FNP while in the presence of Arnette Felts, FNP.  Subjective:   Patient ID: Larry Martin , male    DOB: May 14, 1956 , 67 y.o.   MRN: [id number]  Chief Complaint  Patient presents with   Annual Exam    HPI  Patient presents today for HM, Patient reports compliance with medication. Patient denies any chest pain, SOB, or headaches. Patient has no concerns today. He has recently had treatment for prostate cancer. He has had episodes of urinary urgency since being treated.   BP Readings from Last 3 Encounters: 11/02/23 : 120/80 09/27/23 : (!) 157/96 08/17/23 : Marland Kitchen 146/84       Past Medical History:  Diagnosis Date   BPH with obstruction/lower urinary tract symptoms    Chronic idiopathic constipation    ED (erectile dysfunction)    GERD (gastroesophageal reflux disease)    History of Helicobacter pylori infection 2019   treated   HLD (hyperlipidemia)    Hypertension    in epic -- 12-25-2014  nuclear stress study -- low risk, nuclear ef 62%;  echo ef 50-55%, G2DD, mild TR   Iron deficiency    Malignant neoplasm prostate (HCC) 01/2023   urologist-- dr wrenn/  radiation onologist--- dr Kathrynn Running;  dx 02/ 2024, gleason 4+3   Vitamin D deficiency    Weak urine stream      Family History  Problem Relation Age of Onset   Blindness Mother        not leagally blind   Breast cancer Daughter    Colon cancer Neg Hx    Stomach cancer Neg Hx    Esophageal cancer Neg Hx    Inflammatory bowel disease Neg Hx    Liver disease Neg Hx    Pancreatic cancer Neg Hx    Rectal cancer Neg Hx      Current Outpatient Medications:    HYDROcodone-acetaminophen (NORCO/VICODIN) 5-325 MG tablet, Take 1 tablet by mouth every 6 (six) hours as needed for severe pain., Disp: 10 tablet, Rfl: 0   ondansetron (ZOFRAN) 4 MG tablet, Take 1  tablet (4 mg total) by mouth every 6 (six) hours as needed for nausea or vomiting., Disp: 12 tablet, Rfl: 0   Polyethyl Glycol-Propyl Glycol (SYSTANE OP), Apply to eye as needed (dry eyes)., Disp: , Rfl:    Vitamin D, Ergocalciferol, (DRISDOL) 1.25 MG (50000 UNIT) CAPS capsule, Take 1 capsule (50,000 Units total) by mouth every 7 (seven) days., Disp: 12 capsule, Rfl: 1   amLODipine (NORVASC) 5 MG tablet, Take 1 tablet (5 mg total) by mouth daily., Disp: 90 tablet, Rfl: 1   atorvastatin (LIPITOR) 10 MG tablet, Take 1 tablet (10 mg total) by mouth at bedtime., Disp: 90 tablet, Rfl: 1   hyoscyamine (LEVSIN/SL) 0.125 MG SL tablet, Place 1 tablet (0.125 mg total) under the tongue every 4 (four) hours as needed. (Patient not taking: Reported on 09/25/2023), Disp: , Rfl:    omeprazole (PRILOSEC) 40 MG capsule, Take 1 capsule (40 mg total) by mouth daily., Disp: 90 capsule, Rfl: 1   Probiotic Product (RESTORA) CAPS, Take by mouth as needed. (Patient not taking: Reported on 09/25/2023), Disp: , Rfl:    tamsulosin (FLOMAX) 0.4 MG CAPS capsule, Take 1 capsule (0.4 mg total) by mouth daily after breakfast. (Patient not taking: Reported on 11/02/2023), Disp: 30 capsule, Rfl: 0  No Known Allergies   Men's preventive visit. Patient Health Questionnaire (PHQ-2) is  Flowsheet Row Office Visit from 11/02/2023 in [facility]  PHQ-2 Total Score 0     Patient is on a Regular diet. Exercises by stretching at home, does not go to the gym.  Marital status: Married. Relevant history for alcohol use is:  Social History   Substance and Sexual Activity  Alcohol Use Yes   Comment: occasional   Relevant history for tobacco use is:  Social History   Tobacco Use  Smoking Status Every Day   Current packs/day: 0.50   Average packs/day: 0.5 packs/day for 26.8 years (13.4 ttl pk-yrs)   Types: Cigarettes   Start date: 1998  Smokeless Tobacco Never  Tobacco Comments   05-20-2023  smokes  3 cigarettes per day and when fishing could up to 1/2 - 1ppd.  Smoking since age 41. 11/02/23 - 2 cigarettes per day.      Review of Systems  Constitutional: Negative.   HENT: Negative.    Eyes: Negative.   Respiratory: Negative.    Cardiovascular: Negative.   Gastrointestinal: Negative.   Endocrine: Negative.   Genitourinary: Negative.   Musculoskeletal: Negative.   Skin: Negative.   Allergic/Immunologic: Negative.   Hematological: Negative.   Psychiatric/Behavioral: Negative.       Today's Vitals   11/02/23 1006  BP: 120/80  Pulse: 81  Temp: 98 F (36.7 C)  TempSrc: Oral  Weight: 184 lb 9.6 oz (83.7 kg)  Height: 5\' 6"  (1.676 m)  PainSc: 0-No pain   Body mass index is 29.8 kg/m.  Wt Readings from Last 3 Encounters:  11/02/23 184 lb 9.6 oz (83.7 kg)  09/27/23 171 lb (77.6 kg)  08/17/23 182 lb (82.6 kg)    Objective:  Physical Exam Vitals reviewed.  Constitutional:      General: He is not in acute distress.    Appearance: Normal appearance.  HENT:     Head: Normocephalic and atraumatic.     Right Ear: Tympanic membrane, ear canal and external ear normal. There is no impacted cerumen.     Left Ear: Tympanic membrane, ear canal and external ear normal. There is no impacted cerumen.     Nose:     Comments: Deferred - masked Eyes:     Extraocular Movements: Extraocular movements intact.     Conjunctiva/sclera: Conjunctivae normal.     Pupils: Pupils are equal, round, and reactive to light.  Cardiovascular:     Rate and Rhythm: Normal rate and regular rhythm.     Pulses: Normal pulses.     Heart sounds: Normal heart sounds. No murmur heard. Pulmonary:     Effort: Pulmonary effort is normal. No respiratory distress.     Breath sounds: Normal breath sounds. No wheezing.  Abdominal:     General: Abdomen is flat. Bowel sounds are normal. There is no distension.     Palpations: Abdomen is soft.     Tenderness: There is no abdominal tenderness.  Genitourinary:     Prostate: Normal.     Rectum: Guaiac result negative.  Musculoskeletal:        General: Normal range of motion.     Cervical back: Normal range of motion and neck supple. No tenderness.  Skin:    General: Skin is warm and dry.     Capillary Refill: Capillary refill takes less than 2 seconds.  Neurological:     General: No focal deficit present.  Mental Status: He is alert and oriented to person, place, and time.     Cranial Nerves: No cranial nerve deficit.     Motor: No weakness.  Psychiatric:        Mood and Affect: Mood normal.        Behavior: Behavior normal.        Thought Content: Thought content normal.        Judgment: Judgment normal.         Assessment And Plan:    Encounter for annual health examination Assessment & Plan: Behavior modifications discussed and diet history reviewed.   Pt will continue to exercise regularly and modify diet with low GI, plant based foods and decrease intake of processed foods.  Recommend intake of daily multivitamin, Vitamin D, and calcium.  Recommend mammogram and colonoscopy for preventive screenings, as well as recommend immunizations that include influenza, TDAP, and Covid (declined - had side effects)   Overweight with body mass index (BMI) 25.0-29.9  Familial hypercholesterolemia Assessment & Plan: Cholesterol levels are normal. Continue low fat diet and statin, tolerating well  Orders: -     Atorvastatin Calcium; Take 1 tablet (10 mg total) by mouth at bedtime.  Dispense: 90 tablet; Refill: 1  COVID-19 vaccination declined Assessment & Plan: Declines covid 19 vaccine. Discussed risk of covid 24 and if he changes her mind about the vaccine to call the office. Education has been provided regarding the importance of this vaccine but patient still declined. Advised may receive this vaccine at local pharmacy or Health Dept.or vaccine clinic. Aware to provide a copy of the vaccination record if obtained from local pharmacy or  Health Dept.  Encouraged to take multivitamin, vitamin d, vitamin c and zinc to increase immune system. Aware can call office if would like to have vaccine here at office. Verbalized acceptance and understanding.    Primary hypertension Assessment & Plan: Blood pressure is slightly elevated diastolic, focus on lifestyle modifications and taking medications as directed.   Orders: -     EKG 12-Lead -     POCT URINALYSIS DIP (CLINITEK) -     Microalbumin / creatinine urine ratio -     amLODIPine Besylate; Take 1 tablet (5 mg total) by mouth daily.  Dispense: 90 tablet; Refill: 1  Abnormal glucose Assessment & Plan: Last HgbA1c slightly increased at 5.8, continue focusing on healthy diet low in sugar and starches.  No current medications      Vitamin D deficiency Assessment & Plan: Will check vitamin D level and supplement as needed.    Also encouraged to spend 15 minutes in the sun daily.     Gastroesophageal reflux disease Assessment & Plan: Continue omeprazole daily, doing well  Orders: -     Omeprazole; Take 1 capsule (40 mg total) by mouth daily.  Dispense: 90 capsule; Refill: 1  Other long term (current) drug therapy     No follow-ups on file. Patient was given opportunity to ask questions. Patient verbalized understanding of the plan and was able to repeat key elements of the plan. All questions were answered to their satisfaction.   Arnette Felts, FNP  I, Arnette Felts, FNP, have reviewed all documentation for this visit. The documentation on 11/02/23 for the exam, diagnosis, procedures, and orders are all accurate and complete.

## 2023-11-03 LAB — MICROALBUMIN / CREATININE URINE RATIO
Creatinine, Urine: 90.4 mg/dL
Microalb/Creat Ratio: 8 mg/g{creat} (ref 0–29)
Microalbumin, Urine: 7.4 ug/mL

## 2023-11-13 ENCOUNTER — Telehealth: Payer: Self-pay | Admitting: *Deleted

## 2023-11-13 NOTE — Progress Notes (Unsigned)
  Care Coordination  Outreach Note  11/13/2023 Name: Larry Martin MRN: [id number] DOB: 09-22-1956   Care Coordination Outreach Attempts: An unsuccessful telephone outreach was attempted today to offer the patient information about available care coordination services.  Follow Up Plan:  Additional outreach attempts will be made to offer the patient care coordination information and services.   Encounter Outcome:  No Answer  Gwenevere Ghazi  Care Coordination Care Guide  Direct Dial: [phone]

## 2023-11-18 NOTE — Progress Notes (Signed)
  Care Coordination   Note   11/18/2023 Name: Larry Martin MRN: [id number] DOB: Jun 10, 1956  Larry Martin is a 67 y.o. year old male who sees Arnette Felts, FNP for primary care. I reached out to Rayna Sexton by phone today to offer care coordination services.  [name] was given information about Care Coordination services today including:   The Care Coordination services include support from the care team which includes your Nurse Coordinator, Clinical Social Worker, or Pharmacist.  The Care Coordination team is here to help remove barriers to the health concerns and goals most important to you. Care Coordination services are voluntary, and the patient may decline or stop services at any time by request to their care team member.   Care Coordination Consent Status: Patient did not agree to participate in care coordination services at this time.    Encounter Outcome:  Patient Refused  [facility]  Care Coordination Care Guide  Direct Dial: [phone]

## 2024-05-02 ENCOUNTER — Ambulatory Visit: Payer: Self-pay | Admitting: Nurse Practitioner

## 2024-05-03 ENCOUNTER — Ambulatory Visit: Payer: 59 | Admitting: Nurse Practitioner

## 2024-05-18 DIAGNOSIS — E663 Overweight: Secondary | ICD-10-CM | POA: Diagnosis not present

## 2024-05-18 DIAGNOSIS — R32 Unspecified urinary incontinence: Secondary | ICD-10-CM | POA: Diagnosis not present

## 2024-05-18 DIAGNOSIS — J439 Emphysema, unspecified: Secondary | ICD-10-CM | POA: Diagnosis not present

## 2024-05-18 DIAGNOSIS — Z8546 Personal history of malignant neoplasm of prostate: Secondary | ICD-10-CM | POA: Diagnosis not present

## 2024-05-18 DIAGNOSIS — Z720 Tobacco use: Secondary | ICD-10-CM | POA: Diagnosis not present

## 2024-05-18 DIAGNOSIS — N400 Enlarged prostate without lower urinary tract symptoms: Secondary | ICD-10-CM | POA: Diagnosis not present

## 2024-05-18 DIAGNOSIS — E559 Vitamin D deficiency, unspecified: Secondary | ICD-10-CM | POA: Diagnosis not present

## 2024-05-18 DIAGNOSIS — Z6829 Body mass index (BMI) 29.0-29.9, adult: Secondary | ICD-10-CM | POA: Diagnosis not present

## 2024-05-18 DIAGNOSIS — E785 Hyperlipidemia, unspecified: Secondary | ICD-10-CM | POA: Diagnosis not present

## 2024-05-18 DIAGNOSIS — Z809 Family history of malignant neoplasm, unspecified: Secondary | ICD-10-CM | POA: Diagnosis not present

## 2024-05-18 DIAGNOSIS — I10 Essential (primary) hypertension: Secondary | ICD-10-CM | POA: Diagnosis not present

## 2024-05-25 NOTE — Progress Notes (Signed)
 [place], CMA,acting as a Neurosurgeon for Susanna Epley, FNP.,have documented all relevant documentation on the behalf of Susanna Epley, FNP,as directed by  Susanna Epley, FNP while in the presence of Susanna Epley, FNP.  Subjective:  Patient ID: Larry Martin , male    DOB: 06/23/1956 , 68 y.o.   MRN: [id number]  Chief Complaint  Patient presents with   Hypertension    Patient presents today for a bp and chol follow up, Patient reports compliance with medication. Patient denies any chest pain, SOB, or headaches. Patient has not taking his BP medication today.    Headache    Patient reports he gets headaches on the left side of his head, patient reports he also feels like he has something in his left ear he reports he feels as if that is causing the headache.     HPI  He had a nurse visit him at the house, checking his blood pressure. He continues to take amlodipine  but did not take this morning due [place] the airport. Sometimes will have a headache on the left side. He is concerned that the feeling he has in his ear is of concern.   He feels like the area in his left ear has increased in size.      Past Medical History:  Diagnosis Date   BPH with obstruction/lower urinary tract symptoms    Chronic idiopathic constipation    ED (erectile dysfunction)    GERD (gastroesophageal reflux disease)    History of Helicobacter pylori infection 2019   treated   HLD (hyperlipidemia)    Hypertension    in epic -- 12-25-2014  nuclear stress study -- low risk, nuclear ef 62%;  echo ef 50-55%, G2DD, mild TR   Iron deficiency    Malignant neoplasm prostate (HCC) 01/2023   urologist-- dr wrenn/  radiation onologist--- dr Lorri Rota;  dx 02/ 2024, gleason 4+3   Vitamin D  deficiency    Weak urine stream      Family History  Problem Relation Age of Onset   Blindness Mother        not leagally blind   Breast cancer Daughter    Colon cancer Neg Hx    Stomach cancer Neg Hx    Esophageal cancer  Neg Hx    Inflammatory bowel disease Neg Hx    Liver disease Neg Hx    Pancreatic cancer Neg Hx    Rectal cancer Neg Hx      Current Outpatient Medications:    HYDROcodone -acetaminophen  (NORCO/VICODIN) 5-325 MG tablet, Take 1 tablet by mouth every 6 (six) hours as needed for severe pain., Disp: 10 tablet, Rfl: 0   omeprazole  (PRILOSEC) 40 MG capsule, Take 1 capsule (40 mg total) by mouth daily., Disp: 90 capsule, Rfl: 1   ondansetron  (ZOFRAN ) 4 MG tablet, Take 1 tablet (4 mg total) by mouth every 6 (six) hours as needed for nausea or vomiting., Disp: 12 tablet, Rfl: 0   Polyethyl Glycol-Propyl Glycol (SYSTANE OP), Apply to eye as needed (dry eyes)., Disp: , Rfl:    Probiotic Product (RESTORA) CAPS, Take by mouth as needed., Disp: , Rfl:    tamsulosin  (FLOMAX ) 0.4 MG CAPS capsule, Take 1 capsule (0.4 mg total) by mouth daily after breakfast., Disp: 30 capsule, Rfl: 0   Vitamin D , Ergocalciferol , (DRISDOL ) 1.25 MG (50000 UNIT) CAPS capsule, Take 1 capsule (50,000 Units total) by mouth every 7 (seven) days., Disp: 12 capsule, Rfl: 1   amLODipine  (NORVASC ) 5 MG  tablet, Take 1 tablet (5 mg total) by mouth daily., Disp: 90 tablet, Rfl: 1   atorvastatin  (LIPITOR) 10 MG tablet, Take 1 tablet (10 mg total) by mouth at bedtime., Disp: 90 tablet, Rfl: 1   No Known Allergies   Review of Systems  Constitutional: Negative.   Respiratory: Negative.    Cardiovascular: Negative.   Neurological: Negative.   Psychiatric/Behavioral: Negative.       Today's Vitals   05/26/24 1055  BP: (!) 150/90  Pulse: 82  Temp: 99.3 F (37.4 C)  TempSrc: Oral  Weight: 189 lb 6.4 oz (85.9 kg)  Height: 5\' 6"  (1.676 m)  PainSc: 0-No pain   Body mass index is 30.57 kg/m.  Wt Readings from Last 3 Encounters:  05/26/24 189 lb 6.4 oz (85.9 kg)  11/02/23 184 lb 9.6 oz (83.7 kg)  09/27/23 171 lb (77.6 kg)    Objective:  Physical Exam Vitals and nursing note reviewed.  Constitutional:      General: He is not in  acute distress.    Appearance: Normal appearance.  HENT:     Head: Normocephalic.     Comments: Left ear with skin tag present to the canal upper  Cardiovascular:     Rate and Rhythm: Normal rate and regular rhythm.     Pulses: Normal pulses.     Heart sounds: Normal heart sounds. No murmur heard. Pulmonary:     Effort: Pulmonary effort is normal. No respiratory distress.     Breath sounds: Normal breath sounds. No wheezing.  Musculoskeletal:        General: No swelling or tenderness.  Skin:    General: Skin is warm and dry.     Capillary Refill: Capillary refill takes less than 2 seconds.  Neurological:     General: No focal deficit present.     Mental Status: He is alert and oriented to person, place, and time.     Cranial Nerves: No cranial nerve deficit.     Motor: No weakness.  Psychiatric:        Mood and Affect: Mood normal.        Behavior: Behavior normal.        Thought Content: Thought content normal.        Judgment: Judgment normal.         Assessment And Plan:  Primary hypertension Assessment & Plan: Blood pressure is elevated, he admits to not taking his blood pressure medications this morning. He is to focus on lifestyle modifications and taking medications as directed.   Orders: -     BMP8+eGFR -     amLODIPine  Besylate; Take 1 tablet (5 mg total) by mouth daily.  Dispense: 90 tablet; Refill: 1  Familial hypercholesterolemia Assessment & Plan: Cholesterol levels are normal. Continue low fat diet and statin, tolerating well  Orders: -     Lipid panel -     Atorvastatin  Calcium ; Take 1 tablet (10 mg total) by mouth at bedtime.  Dispense: 90 tablet; Refill: 1  COVID-19 vaccination declined Assessment & Plan: Declines covid 19 vaccine. Discussed risk of covid 53 and if he changes her mind about the vaccine to call the office. Education has been provided regarding the importance of this vaccine but patient still declined. Advised may receive this vaccine  at local pharmacy or Health Dept.or vaccine clinic. Aware to provide a copy of the vaccination record if obtained from local pharmacy or Health Dept.  Encouraged to take multivitamin, vitamin d , vitamin c and  zinc to increase immune system. Aware can call office if would like to have vaccine here at office. Verbalized acceptance and understanding.    Class 1 obesity due to excess calories with body mass index (BMI) of 30.0 to 30.9 in adult, unspecified whether serious comorbidity present Assessment & Plan: He is encouraged to strive for BMI less than 30 to decrease cardiac risk. Advised to aim for at least 150 minutes of exercise per week.    Vitamin D  deficiency Assessment & Plan: Will check vitamin D  level and supplement as needed.    Also encouraged to spend 15 minutes in the sun daily.    Orders: -     VITAMIN D  25 Hydroxy (Vit-D Deficiency, Fractures)  Pulmonary emphysema, unspecified emphysema type (HCC) Assessment & Plan: He has a diagnosis of pulmonary emphysema but does not use any inhalers. I have encouraged him to quit smoking completely.    Skin tag of ear Assessment & Plan: Left ear with skin tag to inner ear canal. Will refer to dermatology for further evaluation.  Orders: -     Ambulatory referral to Dermatology  Tobacco abuse Assessment & Plan: Smoking cessation instruction/counseling given:  counseled patient on the dangers of tobacco use, advised patient to stop smoking, and reviewed strategies to maximize success    Malignant neoplasm of prostate [facility]) Assessment & Plan: Continue f/u with Urology.     Return for keep same next..  Patient was given opportunity to ask questions. Patient verbalized understanding of the plan and was able to repeat key elements of the plan. All questions were answered to their satisfaction.   Inge Mangle, FNP, have reviewed all documentation for this visit. The documentation on 05/26/24 for the exam, diagnosis,  procedures, and orders are all accurate and complete.   IF YOU HAVE BEEN REFERRED TO A SPECIALIST, IT MAY TAKE 1-2 WEEKS TO SCHEDULE/PROCESS THE REFERRAL. IF YOU HAVE NOT HEARD FROM US /SPECIALIST IN TWO WEEKS, PLEASE GIVE US  A CALL AT [phone].

## 2024-05-26 ENCOUNTER — Encounter: Payer: Self-pay | Admitting: Nurse Practitioner

## 2024-05-26 ENCOUNTER — Ambulatory Visit: Admitting: Nurse Practitioner

## 2024-05-26 VITALS — BP 150/90 | HR 82 | Temp 99.3°F | Ht 66.0 in | Wt 189.4 lb

## 2024-05-26 DIAGNOSIS — Z2821 Immunization not carried out because of patient refusal: Secondary | ICD-10-CM | POA: Diagnosis not present

## 2024-05-26 DIAGNOSIS — L918 Other hypertrophic disorders of the skin: Secondary | ICD-10-CM

## 2024-05-26 DIAGNOSIS — E7801 Familial hypercholesterolemia: Secondary | ICD-10-CM | POA: Diagnosis not present

## 2024-05-26 DIAGNOSIS — C61 Malignant neoplasm of prostate: Secondary | ICD-10-CM

## 2024-05-26 DIAGNOSIS — E66811 Obesity, class 1: Secondary | ICD-10-CM

## 2024-05-26 DIAGNOSIS — I10 Essential (primary) hypertension: Secondary | ICD-10-CM | POA: Diagnosis not present

## 2024-05-26 DIAGNOSIS — J439 Emphysema, unspecified: Secondary | ICD-10-CM | POA: Diagnosis not present

## 2024-05-26 DIAGNOSIS — E6609 Other obesity due to excess calories: Secondary | ICD-10-CM

## 2024-05-26 DIAGNOSIS — Z6830 Body mass index (BMI) 30.0-30.9, adult: Secondary | ICD-10-CM | POA: Diagnosis not present

## 2024-05-26 DIAGNOSIS — E559 Vitamin D deficiency, unspecified: Secondary | ICD-10-CM | POA: Diagnosis not present

## 2024-05-26 DIAGNOSIS — Z720 Tobacco use: Secondary | ICD-10-CM | POA: Diagnosis not present

## 2024-05-26 MED ORDER — ATORVASTATIN CALCIUM 10 MG PO TABS
10.0000 mg | ORAL_TABLET | Freq: Every day | ORAL | 1 refills | Status: AC
Start: 2024-05-26 — End: ?

## 2024-05-26 MED ORDER — AMLODIPINE BESYLATE 5 MG PO TABS: 5.0000 mg | ORAL_TABLET | Freq: Every day | ORAL | 1 refills | Status: AC

## 2024-05-26 NOTE — Patient Instructions (Signed)
 Goal to exercise 150 minutes per week with at least 2 days of strength training Encouraged to park further when at the store, take stairs instead of elevators and to walk in place during commercials. Increase water intake to at least one gallon of water daily. Make sure you are walking at least 10,000 steps per day.

## 2024-05-27 LAB — BMP8+EGFR
BUN/Creatinine Ratio: 8 — ABNORMAL LOW (ref 10–24)
BUN: 10 mg/dL (ref 8–27)
CO2: 25 mmol/L (ref 20–29)
Calcium: 9.6 mg/dL (ref 8.6–10.2)
Chloride: 104 mmol/L (ref 96–106)
Creatinine, Ser: 1.25 mg/dL (ref 0.76–1.27)
Glucose: 79 mg/dL (ref 70–99)
Potassium: 4 mmol/L (ref 3.5–5.2)
Sodium: 145 mmol/L — ABNORMAL HIGH (ref 134–144)
eGFR: 64 mL/min/{1.73_m2} (ref >59)

## 2024-05-27 LAB — LIPID PANEL
Chol/HDL Ratio: 2.8 ratio (ref 0.0–5.0)
Cholesterol, Total: 128 mg/dL (ref 100–199)
HDL: 45 mg/dL (ref >39)
LDL Chol Calc (NIH): 61 mg/dL (ref 0–99)
Triglycerides: 122 mg/dL (ref 0–149)
VLDL Cholesterol Cal: 22 mg/dL (ref 5–40)

## 2024-05-27 LAB — VITAMIN D 25 HYDROXY (VIT D DEFICIENCY, FRACTURES): Vit D, 25-Hydroxy: 46.4 ng/mL (ref 30.0–100.0)

## 2024-05-29 ENCOUNTER — Ambulatory Visit: Payer: Self-pay | Admitting: Nurse Practitioner

## 2024-05-29 ENCOUNTER — Encounter: Payer: Self-pay | Admitting: Nurse Practitioner

## 2024-05-29 NOTE — Assessment & Plan Note (Signed)
 Smoking cessation instruction/counseling given:  counseled patient on the dangers of tobacco use, advised patient to stop smoking, and reviewed strategies to maximize success

## 2024-05-29 NOTE — Assessment & Plan Note (Signed)
Continue f/u with Urology

## 2024-05-29 NOTE — Assessment & Plan Note (Signed)
 Cholesterol levels are normal. Continue low fat diet and statin, tolerating well

## 2024-05-29 NOTE — Assessment & Plan Note (Signed)
 He has a diagnosis of pulmonary emphysema but does not use any inhalers. I have encouraged him to quit smoking completely.

## 2024-05-29 NOTE — Assessment & Plan Note (Signed)
 Blood pressure is elevated, he admits to not taking his blood pressure medications this morning. He is to focus on lifestyle modifications and taking medications as directed.

## 2024-05-29 NOTE — Assessment & Plan Note (Signed)
 He is encouraged to strive for BMI less than 30 to decrease cardiac risk. Advised to aim for at least 150 minutes of exercise per week.

## 2024-05-29 NOTE — Assessment & Plan Note (Signed)
 Will check vitamin D level and supplement as needed.    Also encouraged to spend 15 minutes in the sun daily.

## 2024-05-29 NOTE — Assessment & Plan Note (Signed)
 Left ear with skin tag to inner ear canal. Will refer to dermatology for further evaluation.

## 2024-05-29 NOTE — Assessment & Plan Note (Signed)

## 2024-10-02 ENCOUNTER — Other Ambulatory Visit: Payer: Self-pay | Admitting: Nurse Practitioner

## 2024-10-10 DIAGNOSIS — R300 Dysuria: Secondary | ICD-10-CM | POA: Diagnosis not present

## 2024-10-10 DIAGNOSIS — R399 Unspecified symptoms and signs involving the genitourinary system: Secondary | ICD-10-CM | POA: Diagnosis not present

## 2024-10-10 DIAGNOSIS — R3915 Urgency of urination: Secondary | ICD-10-CM | POA: Diagnosis not present

## 2024-10-10 DIAGNOSIS — R350 Frequency of micturition: Secondary | ICD-10-CM | POA: Diagnosis not present

## 2024-10-10 DIAGNOSIS — C61 Malignant neoplasm of prostate: Secondary | ICD-10-CM | POA: Diagnosis not present

## 2024-10-10 DIAGNOSIS — R351 Nocturia: Secondary | ICD-10-CM | POA: Diagnosis not present

## 2024-10-18 ENCOUNTER — Ambulatory Visit: Admitting: Nurse Practitioner

## 2024-10-29 ENCOUNTER — Other Ambulatory Visit: Payer: Self-pay | Admitting: Nurse Practitioner

## 2024-11-03 ENCOUNTER — Encounter: Payer: 59 | Admitting: Nurse Practitioner

## 2024-12-07 ENCOUNTER — Ambulatory Visit

## 2024-12-07 VITALS — Ht 66.0 in | Wt 187.0 lb

## 2024-12-07 DIAGNOSIS — Z0000 Encounter for general adult medical examination without abnormal findings: Secondary | ICD-10-CM

## 2024-12-07 NOTE — Progress Notes (Signed)
 Chief Complaint  Patient presents with   Medicare Wellness     Subjective:   Larry Martin is a 68 y.o. male who presents for a Medicare Annual Wellness Visit.  Visit info / Clinical Intake: Medicare Wellness Visit Type:: Initial Annual Wellness Visit Persons participating in visit and providing information:: patient Medicare Wellness Visit Mode:: Telephone If telephone:: video declined Since this visit was completed virtually, some vitals may be partially provided or unavailable. Missing vitals are due to the limitations of the virtual format.: Documented vitals are patient reported If Telephone or Video please confirm:: I connected with patient using audio/video enable telemedicine. I verified patient identity with two identifiers, discussed telehealth limitations, and patient agreed to proceed. Patient Location:: home Provider Location:: office Interpreter Needed?: No Pre-visit prep was completed: yes AWV questionnaire completed by patient prior to visit?: no Living arrangements:: lives with spouse/significant other Patient's Overall Health Status Rating: good Typical amount of pain: some Does pain affect daily life?: no Are you currently prescribed opioids?: (!) yes  Dietary Habits and Nutritional Risks How many meals a day?: 2 Eats fruit and vegetables daily?: yes Most meals are obtained by: preparing own meals In the last 2 weeks, have you had any of the following?: none Diabetic:: no  Functional Status Activities of Daily Living (to include ambulation/medication): Independent Ambulation: Independent Medication Administration: Independent Home Management (perform basic housework or laundry): Independent Manage your own finances?: yes Primary transportation is: driving Concerns about vision?: no *vision screening is required for WTM* Concerns about hearing?: no  Fall Screening Falls in the past year?: 0 Number of falls in past year: 0 Was there an injury with  Fall?: 0 Fall Risk Category Calculator: 0 Patient Fall Risk Level: Low Fall Risk  Fall Risk Patient at Risk for Falls Due to: Medication side effect Fall risk Follow up: Falls prevention discussed; Falls evaluation completed  Home and Transportation Safety: All rugs have non-skid backing?: yes All stairs or steps have railings?: N/A, no stairs Grab bars in the bathtub or shower?: yes Have non-skid surface in bathtub or shower?: yes Good home lighting?: yes Regular seat belt use?: yes Hospital stays in the last year:: no  Cognitive Assessment Difficulty concentrating, remembering, or making decisions? : no Will 6CIT or Mini Cog be Completed: yes What year is it?: 0 points What month is it?: 0 points Give patient an address phrase to remember (5 components): [address] About what time is it?: 0 points Count backwards from 20 to 1: 0 points Say the months of the year in reverse: 4 points Repeat the address phrase from earlier: 4 points 6 CIT Score: 8 points  Advance Directives (For Healthcare) Does Patient Have a Medical Advance Directive?: No Would patient like information on creating a medical advance directive?: No - Patient declined  Reviewed/Updated  Reviewed/Updated: Reviewed All (Medical, Surgical, Family, Medications, Allergies, Care Teams, Patient Goals)    Allergies (verified) Patient has no known allergies.   Current Medications (verified) Outpatient Encounter Medications as of 12/07/2024  Medication Sig   amLODipine  (NORVASC ) 5 MG tablet Take 1 tablet (5 mg total) by mouth daily.   atorvastatin  (LIPITOR) 10 MG tablet Take 1 tablet (10 mg total) by mouth at bedtime.   HYDROcodone -acetaminophen  (NORCO/VICODIN) 5-325 MG tablet Take 1 tablet by mouth every 6 (six) hours as needed for severe pain.   omeprazole  (PRILOSEC) 40 MG capsule Take 1 capsule (40 mg total) by mouth daily.   ondansetron  (ZOFRAN ) 4  MG tablet Take 1 tablet (4 mg total) by mouth every  6 (six) hours as needed for nausea or vomiting.   Polyethyl Glycol-Propyl Glycol (SYSTANE OP) Apply to eye as needed (dry eyes).   Probiotic Product (RESTORA) CAPS Take by mouth as needed.   tamsulosin  (FLOMAX ) 0.4 MG CAPS capsule Take 1 capsule (0.4 mg total) by mouth daily after breakfast.   Vitamin D , Ergocalciferol , (DRISDOL ) 1.25 MG (50000 UNIT) CAPS capsule Take 1 capsule by mouth once a week   No facility-administered encounter medications on file as of 12/07/2024.    History: Past Medical History:  Diagnosis Date   BPH with obstruction/lower urinary tract symptoms    Chronic idiopathic constipation    ED (erectile dysfunction)    GERD (gastroesophageal reflux disease)    History of Helicobacter pylori infection 2019   treated   HLD (hyperlipidemia)    Hypertension    in epic -- 12-25-2014  nuclear stress study -- low risk, nuclear ef 62%;  echo ef 50-55%, G2DD, mild TR   Iron deficiency    Malignant neoplasm prostate [facility] 01/2023   urologist-- dr wrenn/  radiation onologist--- dr patrcia;  dx 02/ 2024, gleason 4+3   Vitamin D  deficiency    Weak urine stream    Past Surgical History:  Procedure Laterality Date   COLONOSCOPY WITH ESOPHAGOGASTRODUODENOSCOPY (EGD)  10/12/2018   dr wilhelmenia   GOLD SEED IMPLANT N/A 06/02/2023   Procedure: GOLD SEED IMPLANT;  Surgeon: Nieves Cough, MD;  Location: [facility];  Service: Urology;  Laterality: N/A;   NO PAST SURGERIES     SPACE OAR INSTILLATION N/A 06/02/2023   Procedure: SPACE OAR INSTILLATION;  Surgeon: Nieves Cough, MD;  Location: [facility];  Service: Urology;  Laterality: N/A;  30 MINS FOR CASE   Family History  Problem Relation Age of Onset   Blindness Mother        not leagally blind   Breast cancer Daughter    Colon cancer Neg Hx    Stomach cancer Neg Hx    Esophageal cancer Neg Hx    Inflammatory bowel disease Neg Hx    Liver disease Neg Hx    Pancreatic cancer Neg Hx     Rectal cancer Neg Hx    Social History   Occupational History   Not on file  Tobacco Use   Smoking status: Every Day    Current packs/day: 0.50    Average packs/day: 0.5 packs/day for 27.9 years (14.0 ttl pk-yrs)    Types: Cigarettes    Start date: 1998   Smokeless tobacco: Never   Tobacco comments:    05-20-2023  smokes 3 cigarettes per day and when fishing could up to 1/2 - 1ppd.  Smoking since age 71. 11/02/23 - 2 cigarettes per day.   Vaping Use   Vaping status: Never Used  Substance and Sexual Activity   Alcohol use: Yes    Comment: occasional   Drug use: Never   Sexual activity: Not on file   Tobacco Counseling Ready to quit: Not Answered Counseling given: Not Answered Tobacco comments: 05-20-2023  smokes 3 cigarettes per day and when fishing could up to 1/2 - 1ppd.  Smoking since age 78. 11/02/23 - 2 cigarettes per day.   SDOH Screenings   Food Insecurity: No Food Insecurity (12/07/2024)  Housing: Unknown (12/07/2024)  Transportation Needs: No Transportation Needs (12/07/2024)  Utilities: Not At Risk (12/07/2024)  Alcohol Screen: Low Risk  (12/07/2024)  Depression (PHQ2-9):  Low Risk  (11/02/2023)  Financial Resource Strain: Low Risk  (12/07/2024)  Physical Activity: Insufficiently Active (12/07/2024)  Social Connections: Moderately Integrated (12/07/2024)  Stress: No Stress Concern Present (12/07/2024)  Tobacco Use: High Risk (12/07/2024)  Health Literacy: Adequate Health Literacy (12/07/2024)   See flowsheets for full screening details  Depression Screen     Goals Addressed             This Visit's Progress    Patient Stated       12/07/2024, stay healthy             Objective:    Today's Vitals   12/07/24 1024  Weight: 187 lb (84.8 kg)  Height: 5' 6 (1.676 m)   Body mass index is 30.18 kg/m.  Hearing/Vision screen Hearing Screening - Comments:: Denies hearing issues Vision Screening - Comments:: Regular eye exams, [place] Immunizations  and Health Maintenance Health Maintenance  Topic Date Due   Pneumococcal Vaccine: 50+ Years (1 of 2 - PCV) Never done   Influenza Vaccine  07/29/2024   COVID-19 Vaccine (4 - 2025-26 season) 08/29/2024   Medicare Annual Wellness (AWV)  12/07/2025   Colonoscopy  10/12/2028   DTaP/Tdap/Td (2 - Td or Tdap) 10/03/2031   Hepatitis C Screening  Completed   Meningococcal B Vaccine  Aged Out   Zoster Vaccines- Shingrix   Discontinued        Assessment/Plan:  This is a routine wellness examination for Claiborne.  Patient Care Team: Georgina Speaks, FNP as PCP - General (General Practice) Vertell Pont, RN as Oncology Nurse Navigator Watt Rush, MD as Attending Physician (Urology) Patrcia Cough, MD as Consulting Physician (Radiation Oncology) Starla Wendelyn BIRCH, RN as Registered Nurse  I have personally reviewed and noted the following in the patients chart:   Medical and social history Use of alcohol, tobacco or illicit drugs  Current medications and supplements including opioid prescriptions. Functional ability and status Nutritional status Physical activity Advanced directives List of other physicians Hospitalizations, surgeries, and ER visits in previous 12 months Vitals Screenings to include cognitive, depression, and falls Referrals and appointments  No orders of the defined types were placed in this encounter.  In addition, I have reviewed and discussed with patient certain preventive protocols, quality metrics, and best practice recommendations. A written personalized care plan for preventive services as well as general preventive health recommendations were provided to patient.   Ardella FORBES Dawn, LPN   [id number]   Return in 1 year (on 12/07/2025).  After Visit Summary: (MyChart) Due to this being a telephonic visit, the after visit summary with patients personalized plan was offered to patient via MyChart   Nurse Notes: Patient advised to keep follow-up appointment with PCP  (02/22/2025) Vaccines not given: Will obtain pneumonia and flu at next visit. Declines covid vaccine

## 2024-12-07 NOTE — Patient Instructions (Signed)
 Larry Martin,  Thank you for taking the time for your Medicare Wellness Visit. I appreciate your continued commitment to your health goals. Please review the care plan we discussed, and feel free to reach out if I can assist you further.  Please note that Annual Wellness Visits do not include a physical exam. Some assessments may be limited, especially if the visit was conducted virtually. If needed, we may recommend an in-person follow-up with your provider.  Ongoing Care Seeing your primary care provider every 3 to 6 months helps us  monitor your health and provide consistent, personalized care.   Referrals If a referral was made during today's visit and you haven't received any updates within two weeks, please contact the referred provider directly to check on the status.  Recommended Screenings:  Health Maintenance  Topic Date Due   Medicare Annual Wellness Visit  Never done   Pneumococcal Vaccine for age over 64 (1 of 2 - PCV) Never done   Flu Shot  07/29/2024   COVID-19 Vaccine (4 - 2025-26 season) 08/29/2024   Colon Cancer Screening  10/12/2028   DTaP/Tdap/Td vaccine (2 - Td or Tdap) 10/03/2031   Hepatitis C Screening  Completed   Meningitis B Vaccine  Aged Out   Zoster (Shingles) Vaccine  Discontinued       12/07/2024   10:35 AM  Advanced Directives  Does Patient Have a Medical Advance Directive? No  Would patient like information on creating a medical advance directive? No - Patient declined    Vision: Annual vision screenings are recommended for early detection of glaucoma, cataracts, and diabetic retinopathy. These exams can also reveal signs of chronic conditions such as diabetes and high blood pressure.  Dental: Annual dental screenings help detect early signs of oral cancer, gum disease, and other conditions linked to overall health, including heart disease and diabetes.  Please see the attached documents for additional preventive care recommendations.

## 2025-01-16 ENCOUNTER — Ambulatory Visit: Admitting: Physician Assistant

## 2025-01-16 ENCOUNTER — Encounter: Payer: Self-pay | Admitting: Physician Assistant

## 2025-01-16 VITALS — HR 87

## 2025-01-16 DIAGNOSIS — D492 Neoplasm of unspecified behavior of bone, soft tissue, and skin: Secondary | ICD-10-CM

## 2025-01-16 DIAGNOSIS — D485 Neoplasm of uncertain behavior of skin: Secondary | ICD-10-CM

## 2025-01-16 NOTE — Patient Instructions (Signed)

## 2025-01-16 NOTE — Progress Notes (Signed)
" ° °  New Patient Visit   Subjective  Larry Martin is a 69 y.o. male NEW PATIENT who was REFERRED by PCP for lesion in ear canal.    The following portions of the chart were reviewed this encounter and updated as appropriate: medications, allergies, medical history  Review of Systems:  No other skin or systemic complaints except as noted in HPI or Assessment and Plan.  Objective  Well appearing patient in no apparent distress; mood and affect are within normal limits.   A focused examination was performed of the following areas: Left Ear  Relevant exam findings are noted in the Assessment and Plan.    Assessment & Plan   NEOPLASM OF UNCERTAIN BEHAVIOR - LEFT EAR Exam: fimbriated pedunculated papule (see photo)   Plan:  - referral to ENT for further evaluation and treatment as this is somewhat difficult to reach.  - Ddx: wart vs: other    NEOPLASM OF EAR   This Visit - Ambulatory referral to ENT  No follow-ups on file.  I, Doyce Pan, CMA, am acting as scribe for Felipa Laroche K, PA-C.   Documentation: I have reviewed the above documentation for accuracy and completeness, and I agree with the above.  Larry Felty K, PA-C     "

## 2025-02-02 NOTE — Progress Notes (Signed)
 THADD APUZZO                                          MRN: [id number]   02/02/2025   The [facility] Quality Team Specialist reviewed this patient medical record for the purposes of chart review for care gap closure. The following were reviewed: chart review for care gap closure-controlling blood pressure.    [facility] Quality Team

## 2025-02-22 ENCOUNTER — Encounter: Admitting: Nurse Practitioner

## 2025-02-28 ENCOUNTER — Institutional Professional Consult (permissible substitution) (INDEPENDENT_AMBULATORY_CARE_PROVIDER_SITE_OTHER): Admitting: Otolaryngology

## 2025-12-08 ENCOUNTER — Ambulatory Visit
